# Patient Record
Sex: Female | Born: 1970 | Race: Black or African American | Hispanic: No | Marital: Married | Smoking: Never smoker
Health system: Southern US, Community
[De-identification: ages and names within clinical notes are randomized; demographics above are authoritative.]

## PROBLEM LIST (undated history)

## (undated) DIAGNOSIS — N760 Acute vaginitis: Secondary | ICD-10-CM

## (undated) DIAGNOSIS — B9689 Other specified bacterial agents as the cause of diseases classified elsewhere: Secondary | ICD-10-CM

## (undated) DIAGNOSIS — Z8619 Personal history of other infectious and parasitic diseases: Secondary | ICD-10-CM

## (undated) DIAGNOSIS — A499 Bacterial infection, unspecified: Secondary | ICD-10-CM

## (undated) DIAGNOSIS — K219 Gastro-esophageal reflux disease without esophagitis: Secondary | ICD-10-CM

## (undated) DIAGNOSIS — T7840XA Allergy, unspecified, initial encounter: Secondary | ICD-10-CM

## (undated) DIAGNOSIS — I8390 Asymptomatic varicose veins of unspecified lower extremity: Secondary | ICD-10-CM

## (undated) DIAGNOSIS — IMO0002 Reserved for concepts with insufficient information to code with codable children: Secondary | ICD-10-CM

## (undated) DIAGNOSIS — A749 Chlamydial infection, unspecified: Secondary | ICD-10-CM

## (undated) DIAGNOSIS — J45909 Unspecified asthma, uncomplicated: Secondary | ICD-10-CM

## (undated) DIAGNOSIS — G43909 Migraine, unspecified, not intractable, without status migrainosus: Secondary | ICD-10-CM

## (undated) DIAGNOSIS — L509 Urticaria, unspecified: Secondary | ICD-10-CM

## (undated) DIAGNOSIS — Z87898 Personal history of other specified conditions: Secondary | ICD-10-CM

## (undated) DIAGNOSIS — B3731 Acute candidiasis of vulva and vagina: Secondary | ICD-10-CM

## (undated) DIAGNOSIS — B373 Candidiasis of vulva and vagina: Secondary | ICD-10-CM

## (undated) HISTORY — PX: WISDOM TOOTH EXTRACTION: SHX21

## (undated) HISTORY — DX: Other specified bacterial agents as the cause of diseases classified elsewhere: B96.89

## (undated) HISTORY — DX: Personal history of other infectious and parasitic diseases: Z86.19

## (undated) HISTORY — DX: Candidiasis of vulva and vagina: B37.3

## (undated) HISTORY — DX: Reserved for concepts with insufficient information to code with codable children: IMO0002

## (undated) HISTORY — DX: Personal history of other specified conditions: Z87.898

## (undated) HISTORY — DX: Asymptomatic varicose veins of unspecified lower extremity: I83.90

## (undated) HISTORY — DX: Bacterial infection, unspecified: A49.9

## (undated) HISTORY — DX: Migraine, unspecified, not intractable, without status migrainosus: G43.909

## (undated) HISTORY — DX: Chlamydial infection, unspecified: A74.9

## (undated) HISTORY — DX: Unspecified asthma, uncomplicated: J45.909

## (undated) HISTORY — DX: Acute candidiasis of vulva and vagina: B37.31

## (undated) HISTORY — DX: Other specified bacterial agents as the cause of diseases classified elsewhere: N76.0

---

## 1998-08-22 ENCOUNTER — Encounter: Payer: Self-pay | Admitting: Emergency Medicine

## 1998-08-22 ENCOUNTER — Emergency Department (HOSPITAL_COMMUNITY): Admission: EM | Admit: 1998-08-22 | Discharge: 1998-08-22 | Payer: Self-pay | Admitting: Emergency Medicine

## 1998-08-25 ENCOUNTER — Ambulatory Visit (HOSPITAL_COMMUNITY): Admission: RE | Admit: 1998-08-25 | Discharge: 1998-08-25 | Payer: Self-pay | Admitting: Family Medicine

## 1999-03-01 ENCOUNTER — Other Ambulatory Visit: Admission: RE | Admit: 1999-03-01 | Discharge: 1999-03-01 | Payer: Self-pay | Admitting: Family Medicine

## 1999-11-11 ENCOUNTER — Emergency Department (HOSPITAL_COMMUNITY): Admission: EM | Admit: 1999-11-11 | Discharge: 1999-11-11 | Payer: Self-pay | Admitting: Emergency Medicine

## 1999-12-25 ENCOUNTER — Encounter: Payer: Self-pay | Admitting: *Deleted

## 1999-12-25 ENCOUNTER — Ambulatory Visit (HOSPITAL_COMMUNITY): Admission: RE | Admit: 1999-12-25 | Discharge: 1999-12-25 | Payer: Self-pay | Admitting: *Deleted

## 2000-03-09 ENCOUNTER — Inpatient Hospital Stay (HOSPITAL_COMMUNITY): Admission: AD | Admit: 2000-03-09 | Discharge: 2000-03-09 | Payer: Self-pay | Admitting: *Deleted

## 2000-04-06 ENCOUNTER — Inpatient Hospital Stay (HOSPITAL_COMMUNITY): Admission: AD | Admit: 2000-04-06 | Discharge: 2000-04-06 | Payer: Self-pay | Admitting: *Deleted

## 2000-04-11 ENCOUNTER — Inpatient Hospital Stay (HOSPITAL_COMMUNITY): Admission: AD | Admit: 2000-04-11 | Discharge: 2000-04-11 | Payer: Self-pay | Admitting: *Deleted

## 2000-04-16 ENCOUNTER — Inpatient Hospital Stay (HOSPITAL_COMMUNITY): Admission: AD | Admit: 2000-04-16 | Discharge: 2000-04-18 | Payer: Self-pay | Admitting: *Deleted

## 2000-11-11 ENCOUNTER — Emergency Department (HOSPITAL_COMMUNITY): Admission: EM | Admit: 2000-11-11 | Discharge: 2000-11-11 | Payer: Self-pay | Admitting: Emergency Medicine

## 2001-01-06 ENCOUNTER — Emergency Department (HOSPITAL_COMMUNITY): Admission: EM | Admit: 2001-01-06 | Discharge: 2001-01-06 | Payer: Self-pay | Admitting: Emergency Medicine

## 2001-09-15 ENCOUNTER — Other Ambulatory Visit: Admission: RE | Admit: 2001-09-15 | Discharge: 2001-09-15 | Payer: Self-pay | Admitting: Obstetrics and Gynecology

## 2001-09-20 ENCOUNTER — Emergency Department (HOSPITAL_COMMUNITY): Admission: EM | Admit: 2001-09-20 | Discharge: 2001-09-20 | Payer: Self-pay | Admitting: Emergency Medicine

## 2001-09-20 ENCOUNTER — Encounter: Payer: Self-pay | Admitting: Emergency Medicine

## 2001-12-06 ENCOUNTER — Emergency Department (HOSPITAL_COMMUNITY): Admission: EM | Admit: 2001-12-06 | Discharge: 2001-12-06 | Payer: Self-pay | Admitting: Emergency Medicine

## 2001-12-24 ENCOUNTER — Encounter: Payer: Self-pay | Admitting: Internal Medicine

## 2001-12-24 ENCOUNTER — Encounter: Admission: RE | Admit: 2001-12-24 | Discharge: 2001-12-24 | Payer: Self-pay | Admitting: Internal Medicine

## 2002-03-06 ENCOUNTER — Emergency Department (HOSPITAL_COMMUNITY): Admission: EM | Admit: 2002-03-06 | Discharge: 2002-03-07 | Payer: Self-pay

## 2002-06-26 ENCOUNTER — Emergency Department (HOSPITAL_COMMUNITY): Admission: EM | Admit: 2002-06-26 | Discharge: 2002-06-26 | Payer: Self-pay | Admitting: Emergency Medicine

## 2002-08-02 ENCOUNTER — Emergency Department (HOSPITAL_COMMUNITY): Admission: EM | Admit: 2002-08-02 | Discharge: 2002-08-02 | Payer: Self-pay | Admitting: *Deleted

## 2002-09-02 ENCOUNTER — Encounter: Admission: RE | Admit: 2002-09-02 | Discharge: 2002-09-24 | Payer: Self-pay | Admitting: Internal Medicine

## 2002-12-27 ENCOUNTER — Encounter: Payer: Self-pay | Admitting: Emergency Medicine

## 2002-12-27 ENCOUNTER — Emergency Department (HOSPITAL_COMMUNITY): Admission: EM | Admit: 2002-12-27 | Discharge: 2002-12-27 | Payer: Self-pay | Admitting: Emergency Medicine

## 2003-05-08 ENCOUNTER — Encounter: Payer: Self-pay | Admitting: Emergency Medicine

## 2003-05-08 ENCOUNTER — Emergency Department (HOSPITAL_COMMUNITY): Admission: EM | Admit: 2003-05-08 | Discharge: 2003-05-08 | Payer: Self-pay | Admitting: Emergency Medicine

## 2003-09-04 ENCOUNTER — Emergency Department (HOSPITAL_COMMUNITY): Admission: EM | Admit: 2003-09-04 | Discharge: 2003-09-04 | Payer: Self-pay | Admitting: Emergency Medicine

## 2003-09-12 ENCOUNTER — Encounter: Admission: RE | Admit: 2003-09-12 | Discharge: 2003-09-12 | Payer: Self-pay | Admitting: Internal Medicine

## 2003-12-17 ENCOUNTER — Emergency Department (HOSPITAL_COMMUNITY): Admission: EM | Admit: 2003-12-17 | Discharge: 2003-12-17 | Payer: Self-pay | Admitting: Emergency Medicine

## 2004-01-01 ENCOUNTER — Emergency Department (HOSPITAL_COMMUNITY): Admission: EM | Admit: 2004-01-01 | Discharge: 2004-01-01 | Payer: Self-pay | Admitting: *Deleted

## 2004-03-03 ENCOUNTER — Emergency Department (HOSPITAL_COMMUNITY): Admission: EM | Admit: 2004-03-03 | Discharge: 2004-03-03 | Payer: Self-pay | Admitting: Family Medicine

## 2004-03-08 ENCOUNTER — Emergency Department (HOSPITAL_COMMUNITY): Admission: EM | Admit: 2004-03-08 | Discharge: 2004-03-08 | Payer: Self-pay

## 2004-06-24 ENCOUNTER — Emergency Department (HOSPITAL_COMMUNITY): Admission: EM | Admit: 2004-06-24 | Discharge: 2004-06-24 | Payer: Self-pay | Admitting: Emergency Medicine

## 2006-11-06 ENCOUNTER — Emergency Department (HOSPITAL_COMMUNITY): Admission: EM | Admit: 2006-11-06 | Discharge: 2006-11-07 | Payer: Self-pay | Admitting: Emergency Medicine

## 2007-08-08 ENCOUNTER — Emergency Department (HOSPITAL_COMMUNITY): Admission: EM | Admit: 2007-08-08 | Discharge: 2007-08-09 | Payer: Self-pay | Admitting: Emergency Medicine

## 2008-09-24 ENCOUNTER — Emergency Department (HOSPITAL_COMMUNITY): Admission: EM | Admit: 2008-09-24 | Discharge: 2008-09-24 | Payer: Self-pay | Admitting: Emergency Medicine

## 2009-11-11 DIAGNOSIS — R87619 Unspecified abnormal cytological findings in specimens from cervix uteri: Secondary | ICD-10-CM

## 2009-11-11 DIAGNOSIS — IMO0002 Reserved for concepts with insufficient information to code with codable children: Secondary | ICD-10-CM

## 2009-11-11 HISTORY — DX: Reserved for concepts with insufficient information to code with codable children: IMO0002

## 2009-11-11 HISTORY — DX: Unspecified abnormal cytological findings in specimens from cervix uteri: R87.619

## 2011-02-23 ENCOUNTER — Other Ambulatory Visit: Payer: Self-pay | Admitting: Internal Medicine

## 2011-02-23 MED ORDER — CIPROFLOXACIN HCL 500 MG PO TABS: 500.0000 mg | ORAL_TABLET | Freq: Two times a day (BID) | ORAL | Status: AC

## 2011-05-09 ENCOUNTER — Other Ambulatory Visit: Payer: Self-pay | Admitting: Internal Medicine

## 2011-05-09 MED ORDER — AZITHROMYCIN 250 MG PO TABS: ORAL_TABLET | ORAL | Status: AC

## 2011-08-16 ENCOUNTER — Other Ambulatory Visit: Payer: Self-pay | Admitting: Internal Medicine

## 2011-08-16 DIAGNOSIS — Z1231 Encounter for screening mammogram for malignant neoplasm of breast: Secondary | ICD-10-CM

## 2011-08-21 ENCOUNTER — Inpatient Hospital Stay (INDEPENDENT_AMBULATORY_CARE_PROVIDER_SITE_OTHER)
Admission: RE | Admit: 2011-08-21 | Discharge: 2011-08-21 | Disposition: A | Discharge status: Home / Self Care | Payer: 59 | Source: Ambulatory Visit | Attending: Emergency Medicine | Admitting: Emergency Medicine

## 2011-08-21 ENCOUNTER — Ambulatory Visit (INDEPENDENT_AMBULATORY_CARE_PROVIDER_SITE_OTHER): Payer: 59

## 2011-08-21 DIAGNOSIS — S93609A Unspecified sprain of unspecified foot, initial encounter: Secondary | ICD-10-CM

## 2011-08-21 DIAGNOSIS — S91109A Unspecified open wound of unspecified toe(s) without damage to nail, initial encounter: Secondary | ICD-10-CM

## 2011-08-30 ENCOUNTER — Ambulatory Visit: Payer: Self-pay

## 2011-09-02 ENCOUNTER — Ambulatory Visit
Admission: RE | Admit: 2011-09-02 | Discharge: 2011-09-02 | Disposition: A | Discharge status: Home / Self Care | Payer: 59 | Source: Ambulatory Visit | Attending: Internal Medicine | Admitting: Internal Medicine

## 2011-09-02 DIAGNOSIS — Z1231 Encounter for screening mammogram for malignant neoplasm of breast: Secondary | ICD-10-CM

## 2011-11-03 ENCOUNTER — Other Ambulatory Visit: Payer: Self-pay | Admitting: Internal Medicine

## 2011-11-03 MED ORDER — LIDOCAINE VISCOUS 2 % MT SOLN: 20.0000 mL | OROMUCOSAL | Status: AC | PRN

## 2011-11-03 MED ORDER — DIPHENHYD-HYDROCORT-NYSTATIN MT SUSP: 15.0000 mL | Freq: Three times a day (TID) | OROMUCOSAL | Status: DC | PRN

## 2011-11-03 MED ORDER — LIDOCAINE VISCOUS 2 % MT SOLN: 20.0000 mL | OROMUCOSAL | Status: DC | PRN

## 2011-11-03 NOTE — Progress Notes (Signed)
Addended by: Edsel Petrin on: 11/03/2011 08:01 PM   Modules accepted: Orders

## 2012-05-22 ENCOUNTER — Ambulatory Visit (INDEPENDENT_AMBULATORY_CARE_PROVIDER_SITE_OTHER): Payer: 59 | Admitting: Obstetrics and Gynecology

## 2012-05-22 ENCOUNTER — Encounter: Payer: Self-pay | Admitting: Obstetrics and Gynecology

## 2012-05-22 VITALS — BP 100/62 | Ht 62.0 in | Wt 118.0 lb

## 2012-05-22 DIAGNOSIS — Z01419 Encounter for gynecological examination (general) (routine) without abnormal findings: Secondary | ICD-10-CM

## 2012-05-22 DIAGNOSIS — N898 Other specified noninflammatory disorders of vagina: Secondary | ICD-10-CM

## 2012-05-22 LAB — POCT WET PREP (WET MOUNT)
Bacteria Wet Prep HPF POC: NEGATIVE
Clue Cells Wet Prep Whiff POC: NEGATIVE
Trichomonas Wet Prep HPF POC: NEGATIVE
pH: 4.5

## 2012-05-22 NOTE — Progress Notes (Signed)
C/o moodiness, hot flashes, stress related to getting doctorate, increased sensitivity at clitoris  Filed Vitals:   05/22/12 1403  BP: 100/62   ROS: noncontributory  Pelvic exam:  VULVA: normal appearing vulva with no masses, tenderness or lesions,  VAGINA: normal appearing vagina with normal color and discharge, no lesions, CERVIX: normal appearing cervix without discharge or lesions,  UTERUS: uterus is normal size, shape, consistency and nontender,  ADNEXA: normal adnexa in size, nontender and no masses.  Results for orders placed in visit on 05/22/12  POCT WET PREP (WET [place]OUNT)      Component Value Range   Source Wet Prep POC       WBC, Wet Prep HPF POC neg     Bacteria Wet Prep HPF POC neg     BACTERIA WET PREP MORPHOLOGY POC       Clue Cells Wet Prep HPF POC None     CLUE CELLS WET PREP WHIFF POC Negative Whiff     Yeast Wet Prep HPF POC None     KOH Wet Prep POC neg     Trichomonas Wet Prep HPF POC neg     pH        A/P Wet prep Discussed options for hormonal mgmt which are limited.  Pt is already on loestrin but stopped about 1wk ago.  She is interested in changing back to depo.  I think that will help decrease her risks of the combined methods but may exacerbate mood swings.  Hot flashes may be perimenopausal but if not helped with OCPs, she may have to wait it out.  She has never had normal cycles and had bloodwork recently with nl TSH, CBC and vit D. RTO for AEX

## 2012-05-26 ENCOUNTER — Telehealth: Payer: Self-pay | Admitting: Obstetrics and Gynecology

## 2012-05-26 NOTE — Telephone Encounter (Signed)
Triage/epic 

## 2012-05-26 NOTE — Telephone Encounter (Signed)
Per AR, I called two new Rx's to [facility] at [address] and [address]. One is Depo Provera 150 mg/ ml 1x q 3 months IM w/ RF's through 05/2013. The next one was Lotrisone cream 15 gm tube to use once daily to the affected area(clitoris). w one RF. Melody Comas A

## 2012-06-02 ENCOUNTER — Emergency Department (HOSPITAL_COMMUNITY)
Admission: EM | Admit: 2012-06-02 | Discharge: 2012-06-03 | Disposition: A | Discharge status: Home / Self Care | Payer: 59 | Attending: Emergency Medicine | Admitting: Emergency Medicine

## 2012-06-02 ENCOUNTER — Emergency Department (HOSPITAL_COMMUNITY): Payer: 59

## 2012-06-02 ENCOUNTER — Encounter (HOSPITAL_COMMUNITY): Payer: Self-pay | Admitting: Emergency Medicine

## 2012-06-02 DIAGNOSIS — Q742 Other congenital malformations of lower limb(s), including pelvic girdle: Secondary | ICD-10-CM

## 2012-06-02 DIAGNOSIS — M79606 Pain in leg, unspecified: Secondary | ICD-10-CM

## 2012-06-02 DIAGNOSIS — M79609 Pain in unspecified limb: Secondary | ICD-10-CM | POA: Insufficient documentation

## 2012-06-02 MED ORDER — HYDROCODONE-ACETAMINOPHEN 5-325 MG PO TABS
1.0000 | ORAL_TABLET | Freq: Once | ORAL | Status: AC
  Administered 2012-06-02: 1 via ORAL
  Filled 2012-06-02: qty 1

## 2012-06-02 MED ORDER — OXYCODONE-ACETAMINOPHEN 5-325 MG PO TABS
1.0000 | ORAL_TABLET | Freq: Once | ORAL | Status: AC
  Administered 2012-06-02: 1 via ORAL
  Filled 2012-06-02: qty 1

## 2012-06-02 MED ORDER — IBUPROFEN 400 MG PO TABS
800.0000 mg | ORAL_TABLET | Freq: Once | ORAL | Status: AC
  Administered 2012-06-02: 800 mg via ORAL
  Filled 2012-06-02: qty 2
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
## 2012-06-02 NOTE — ED Notes (Signed)
Pt reports after going to x-ray she is having a new pain, now c/o right pelvis pain and upper right thigh pain, pain worsens when she sits up.

## 2012-06-03 MED ORDER — NAPROXEN 375 MG PO TABS: 375.0000 mg | ORAL_TABLET | Freq: Two times a day (BID) | ORAL | Status: AC

## 2012-06-03 MED ORDER — OXYCODONE-ACETAMINOPHEN 5-325 MG PO TABS: 1.0000 | ORAL_TABLET | Freq: Four times a day (QID) | ORAL | Status: AC | PRN

## 2012-06-03 NOTE — ED Provider Notes (Signed)
Medical screening examination/treatment/procedure(s) were performed by non-physician practitioner and as supervising physician I was immediately available for consultation/collaboration.  Gerhard Munch, MD 06/03/12 [phone]
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
## 2012-06-03 NOTE — ED Notes (Signed)
Ortho called 

## 2012-06-09 ENCOUNTER — Other Ambulatory Visit (INDEPENDENT_AMBULATORY_CARE_PROVIDER_SITE_OTHER): Payer: 59

## 2012-06-09 ENCOUNTER — Telehealth: Payer: Self-pay | Admitting: Obstetrics and Gynecology

## 2012-06-09 DIAGNOSIS — Z3009 Encounter for other general counseling and advice on contraception: Secondary | ICD-10-CM

## 2012-06-09 MED ORDER — MEDROXYPROGESTERONE ACETATE 150 MG/ML IM SUSP
150.0000 mg | Freq: Once | INTRAMUSCULAR | Status: AC
  Administered 2012-06-09: 150 mg via INTRAMUSCULAR

## 2012-06-09 NOTE — Progress Notes (Unsigned)
Next Depo due 08-31-2012.  Pt states started menses on 06-08-12 advised to make appt when period started.  Pt denies any unprotected intercourse.

## 2012-06-11 ENCOUNTER — Other Ambulatory Visit: Payer: Self-pay | Admitting: Nurse Practitioner

## 2012-06-11 ENCOUNTER — Ambulatory Visit
Admission: RE | Admit: 2012-06-11 | Discharge: 2012-06-11 | Disposition: A | Discharge status: Home / Self Care | Payer: 59 | Source: Ambulatory Visit | Attending: Nurse Practitioner | Admitting: Nurse Practitioner

## 2012-06-11 DIAGNOSIS — M79609 Pain in unspecified limb: Secondary | ICD-10-CM

## 2012-08-27 ENCOUNTER — Telehealth: Payer: Self-pay | Admitting: Obstetrics and Gynecology

## 2012-08-28 NOTE — Telephone Encounter (Signed)
TC to pt. States has had dark, red "pastey" D/C every day since starting Depo. Is aware may have irreg bleeding but is unsure if wants to continue if this will not resolve.  To schedule appt with EP prior to Depo 08/31/12 if able to make payments arrangements.

## 2012-08-31 ENCOUNTER — Ambulatory Visit (INDEPENDENT_AMBULATORY_CARE_PROVIDER_SITE_OTHER): Payer: 59 | Admitting: Obstetrics and Gynecology

## 2012-08-31 ENCOUNTER — Encounter: Payer: Self-pay | Admitting: Obstetrics and Gynecology

## 2012-08-31 ENCOUNTER — Other Ambulatory Visit: Payer: 59

## 2012-08-31 VITALS — BP 114/70 | HR 74 | Wt 127.0 lb

## 2012-08-31 DIAGNOSIS — N926 Irregular menstruation, unspecified: Secondary | ICD-10-CM

## 2012-08-31 DIAGNOSIS — Z309 Encounter for contraceptive management, unspecified: Secondary | ICD-10-CM

## 2012-08-31 LAB — POCT URINE PREGNANCY: Preg Test, Ur: NEGATIVE

## 2012-08-31 MED ORDER — NORETHIN-ETH ESTRAD-FE BIPHAS 1 MG-10 MCG / 10 MCG PO TABS: 1.0000 | ORAL_TABLET | Freq: Every day | ORAL | Status: DC

## 2012-08-31 NOTE — Progress Notes (Signed)
41 YO with daily bleeding requiring a panty liner 3-4/day since Depo Provera injection in July. Next Depo provera due today but wants to consider another method.   O:  UPT-negative       Pelvic: EGBUS-wnl, vagina-moderate brown discharge, cervix-no lesions, uterus/adnexae-no tenderness or masses  A: Irregular Bleeding with Depo Provera  P:  Begin lo loestrin 24 Fe today,  1 po qd  #3 samples given      Reviewed risks of VTE events and side effects      BCP Instructiion Sheet given      RTO-as scheduled or prn  Jenetta Wease, PA-C

## 2012-08-31 NOTE — Progress Notes (Signed)
When did bleeding start: IN JULY How  Long: SINCE PT HAD DEPO PROVERA INJECTION WHICH WAS IN JULY How often changing pad/tampon: 2-3 X A DAY. PT ONLY WEARS PANTY SHEILDS Bleeding Disorders: no Cramping: no Contraception: yes Fibroids: no Hormone Therapy: no New Medications: no Menopausal Symptoms: no Vag. 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

## 2012-11-18 ENCOUNTER — Telehealth: Payer: Self-pay | Admitting: Obstetrics and Gynecology

## 2012-11-18 NOTE — Telephone Encounter (Signed)
LVM to advise pt that samples would be waiting up front from her  Darien Ramus, CMA

## 2013-01-01 ENCOUNTER — Other Ambulatory Visit: Payer: Self-pay | Admitting: Internal Medicine

## 2013-01-01 MED ORDER — AZITHROMYCIN 250 MG PO TABS: ORAL_TABLET | ORAL | Status: DC

## 2013-01-05 ENCOUNTER — Other Ambulatory Visit: Payer: Self-pay | Admitting: Internal Medicine

## 2013-01-05 MED ORDER — LEVOFLOXACIN 500 MG PO TABS: 500.0000 mg | ORAL_TABLET | Freq: Every day | ORAL | Status: AC

## 2013-01-06 ENCOUNTER — Other Ambulatory Visit: Payer: Self-pay | Admitting: Internal Medicine

## 2013-01-06 DIAGNOSIS — Z1231 Encounter for screening mammogram for malignant neoplasm of breast: Secondary | ICD-10-CM

## 2013-01-07 ENCOUNTER — Ambulatory Visit: Payer: 59

## 2013-01-18 ENCOUNTER — Telehealth: Payer: Self-pay | Admitting: Obstetrics and Gynecology

## 2013-01-18 MED ORDER — NORETHIN-ETH ESTRAD-FE BIPHAS 1 MG-10 MCG / 10 MCG PO TABS: 1.0000 | ORAL_TABLET | Freq: Every day | ORAL | Status: DC

## 2013-01-18 NOTE — Telephone Encounter (Signed)
2 samples given. rx sent to pharmacy.  Darien Ramus, CMA

## 2013-02-01 ENCOUNTER — Ambulatory Visit
Admission: RE | Admit: 2013-02-01 | Discharge: 2013-02-01 | Disposition: A | Discharge status: Home / Self Care | Payer: BC Managed Care – PPO | Source: Ambulatory Visit | Attending: Internal Medicine | Admitting: Internal Medicine

## 2013-02-01 DIAGNOSIS — Z1231 Encounter for screening mammogram for malignant neoplasm of breast: Secondary | ICD-10-CM

## 2014-09-12 ENCOUNTER — Encounter: Payer: Self-pay | Admitting: Obstetrics and Gynecology

## 2016-06-30 ENCOUNTER — Encounter (HOSPITAL_COMMUNITY): Payer: Self-pay | Admitting: Emergency Medicine

## 2016-06-30 ENCOUNTER — Emergency Department (HOSPITAL_COMMUNITY)
Admission: EM | Admit: 2016-06-30 | Discharge: 2016-07-01 | Disposition: A | Discharge status: Home / Self Care | Payer: Medicaid Other | Attending: Emergency Medicine | Admitting: Emergency Medicine

## 2016-06-30 ENCOUNTER — Emergency Department (HOSPITAL_COMMUNITY): Payer: Medicaid Other

## 2016-06-30 DIAGNOSIS — R0602 Shortness of breath: Secondary | ICD-10-CM | POA: Diagnosis not present

## 2016-06-30 DIAGNOSIS — R51 Headache: Secondary | ICD-10-CM | POA: Diagnosis not present

## 2016-06-30 DIAGNOSIS — R05 Cough: Secondary | ICD-10-CM | POA: Diagnosis not present

## 2016-06-30 DIAGNOSIS — R0789 Other chest pain: Secondary | ICD-10-CM | POA: Diagnosis not present

## 2016-06-30 DIAGNOSIS — Z793 Long term (current) use of hormonal contraceptives: Secondary | ICD-10-CM | POA: Insufficient documentation

## 2016-06-30 DIAGNOSIS — R519 Headache, unspecified: Secondary | ICD-10-CM

## 2016-06-30 DIAGNOSIS — R0989 Other specified symptoms and signs involving the circulatory and respiratory systems: Secondary | ICD-10-CM

## 2016-06-30 MED ORDER — KETOROLAC TROMETHAMINE 15 MG/ML IJ SOLN
15.0000 mg | Freq: Once | INTRAMUSCULAR | Status: AC
  Administered 2016-07-01: 15 mg via INTRAVENOUS
  Filled 2016-06-30: qty 1

## 2016-06-30 MED ORDER — METOCLOPRAMIDE HCL 5 MG/ML IJ SOLN
10.0000 mg | Freq: Once | INTRAMUSCULAR | Status: AC
  Administered 2016-07-01: 10 mg via INTRAVENOUS
  Filled 2016-06-30: qty 2

## 2016-06-30 MED ORDER — DIPHENHYDRAMINE HCL 50 MG/ML IJ SOLN
12.5000 mg | Freq: Once | INTRAMUSCULAR | Status: AC
  Administered 2016-06-30: 12.5 mg via INTRAVENOUS
  Filled 2016-06-30: qty 1

## 2016-06-30 MED ORDER — MAGNESIUM SULFATE IN D5W 1-5 GM/100ML-% IV SOLN
1.0000 g | Freq: Once | INTRAVENOUS | Status: AC
  Administered 2016-06-30: 1 g via INTRAVENOUS
  Filled 2016-06-30: qty 100

## 2016-06-30 MED ORDER — KETOROLAC TROMETHAMINE 30 MG/ML IJ SOLN
15.0000 mg | Freq: Once | INTRAMUSCULAR | Status: AC
  Administered 2016-06-30: 15 mg via INTRAVENOUS
  Filled 2016-06-30: qty 1

## 2016-06-30 MED ORDER — LORAZEPAM 2 MG/ML IJ SOLN
1.0000 mg | Freq: Once | INTRAMUSCULAR | Status: AC
  Administered 2016-07-01: 1 mg via INTRAVENOUS
  Filled 2016-06-30: qty 1

## 2016-06-30 MED ORDER — SODIUM CHLORIDE 0.9 % IV BOLUS (SEPSIS)
1000.0000 mL | Freq: Once | INTRAVENOUS | Status: AC
  Administered 2016-06-30: 1000 mL via INTRAVENOUS

## 2016-06-30 MED ORDER — ONDANSETRON HCL 4 MG/2ML IJ SOLN
4.0000 mg | Freq: Once | INTRAMUSCULAR | Status: AC
  Administered 2016-06-30: 4 mg via INTRAVENOUS
  Filled 2016-06-30: qty 2
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
## 2016-06-30 NOTE — ED Triage Notes (Signed)
Patient c/o migraine x several days.  Patient that states she is under a lot of stress and trying to look for a new job.  Patient states that has sensitivity to light and denies blurred vision or trouble seeing.  Patient having post nasal drainage and non productive cough at night.  Patient been taking OTC meds for headaceh but not working.

## 2016-06-30 NOTE — ED Notes (Signed)
Called Pt from the lobby with no answer

## 2016-07-01 ENCOUNTER — Emergency Department (HOSPITAL_COMMUNITY): Payer: Medicaid Other

## 2016-07-01 LAB — I-STAT CHEM 8, ED
BUN: 11 mg/dL (ref 6–20)
CALCIUM ION: 1.13 mmol/L (ref 1.13–1.30)
CHLORIDE: 106 mmol/L (ref 101–111)
Creatinine, Ser: 0.8 mg/dL (ref 0.44–1.00)
GLUCOSE: 93 mg/dL (ref 65–99)
HCT: 42 % (ref 36.0–46.0)
HEMOGLOBIN: 14.3 g/dL (ref 12.0–15.0)
Potassium: 3.7 mmol/L (ref 3.5–5.1)
SODIUM: 139 mmol/L (ref 135–145)
TCO2: 21 mmol/L (ref 0–100)

## 2016-07-01 LAB — BRAIN NATRIURETIC PEPTIDE: B NATRIURETIC PEPTIDE 5: 22.3 pg/mL (ref 0.0–100.0)

## 2016-07-01 MED ORDER — PSEUDOEPHEDRINE HCL ER 120 MG PO TB12: 120.0000 mg | ORAL_TABLET | Freq: Two times a day (BID) | ORAL | 0 refills | Status: DC | PRN

## 2016-07-01 MED ORDER — CETIRIZINE HCL 10 MG PO TABS: 10.0000 mg | ORAL_TABLET | Freq: Every day | ORAL | 0 refills | Status: DC

## 2016-07-01 MED ORDER — LORAZEPAM 2 MG/ML IJ SOLN
1.0000 mg | Freq: Once | INTRAMUSCULAR | Status: AC
  Administered 2016-07-01: 1 mg via INTRAVENOUS
  Filled 2016-07-01: qty 1

## 2016-07-01 MED ORDER — GUAIFENESIN ER 600 MG PO TB12
600.0000 mg | ORAL_TABLET | Freq: Two times a day (BID) | ORAL | 0 refills | Status: DC | PRN
Start: 2016-07-01 — End: 2018-02-16

## 2016-07-01 NOTE — ED Notes (Signed)
Pt returned from MRI °

## 2016-07-01 NOTE — ED Provider Notes (Signed)
Signout received from Dr. Clydene PughKnott.  Please refer to their note for full history, physical exam, and original plan.  In brief, Emma Larsen is a 45 y.o. female with headache, improved with migraine medicines, but pending MRV.  MDM:  MRV unremarkable.  Patient without headache at this time and sleeping comfortably.  Neurologic Exam Alert and oriented CN II-XII grossly intact Eyes: PERRL, EOMI Bilateral UE strength 5/5 Bilateral LE strength 5/5 Intact gait  Impression of the event is likely migraine headache.  Will have her f/u with PCP.  We have discussed the discharge plan, including the plan for outpatient followup, and strict return precautions, including those that would require calling 911.    Patient discussed with Dr. Rubin PayorPickering, who voiced agreement with plan.    Emma MorelMichael Dajana Gehrig, MD 07/01/16 [id number]    Emma CoreNathan Pickering, MD 07/02/16 [phone]

## 2016-07-01 NOTE — Discharge Instructions (Addendum)
For your chest congestion, you should try taking over the counter Zyrtec daily, as well as guaifenesin-dextromethorphan as directed by the bottle.  Read the information below.   Your EKG and chest x-ray were re-assuring. Your labs were normal. CT of your head was normal.  Continue to take 600mg  motrin every 6hrs for the next few days.  I have provided the contact information for Neurology. Please call to schedule a follow up appointment regarding your headaches.  Please call your primary care doctor to schedule a follow up appointment for re-evaluation following your ED visit.  You may return to the Emergency Department at any time for worsening condition or any new symptoms that concern you. Return to the ED if you develop facial droop, slurred speech, numbness/weakness, fever, chest pain, difficulty breathing while walking, chest pain with deep inspiration, or one sided leg swelling/pain.

## 2016-07-01 NOTE — ED Provider Notes (Signed)
Patient was seen and evaluated on arrival to the emergency department.  MRV ordered, will f/u to evaluate for dural sinus thrombosis.   Lyndal Pulleyaniel Kainon Varady, MD 07/01/16 [phone]

## 2016-07-01 NOTE — ED Notes (Signed)
Patient transported to MRI 

## 2016-07-05 ENCOUNTER — Other Ambulatory Visit: Payer: Self-pay | Admitting: Internal Medicine

## 2016-07-05 DIAGNOSIS — Z1231 Encounter for screening mammogram for malignant neoplasm of breast: Secondary | ICD-10-CM

## 2016-07-09 ENCOUNTER — Ambulatory Visit: Payer: Medicaid Other

## 2016-07-09 ENCOUNTER — Ambulatory Visit
Admission: RE | Admit: 2016-07-09 | Discharge: 2016-07-09 | Disposition: A | Discharge status: Home / Self Care | Payer: Medicaid Other | Source: Ambulatory Visit | Attending: Internal Medicine | Admitting: Internal Medicine

## 2016-07-09 DIAGNOSIS — Z1231 Encounter for screening mammogram for malignant neoplasm of breast: Secondary | ICD-10-CM

## 2016-09-12 ENCOUNTER — Emergency Department (HOSPITAL_COMMUNITY)
Admission: EM | Admit: 2016-09-12 | Discharge: 2016-09-12 | Disposition: A | Discharge status: Home / Self Care | Payer: 59 | Attending: Emergency Medicine | Admitting: Emergency Medicine

## 2016-09-12 ENCOUNTER — Emergency Department (HOSPITAL_COMMUNITY): Payer: 59

## 2016-09-12 ENCOUNTER — Encounter (HOSPITAL_COMMUNITY): Payer: Self-pay | Admitting: Emergency Medicine

## 2016-09-12 DIAGNOSIS — K5900 Constipation, unspecified: Secondary | ICD-10-CM

## 2016-09-12 DIAGNOSIS — R51 Headache: Secondary | ICD-10-CM | POA: Insufficient documentation

## 2016-09-12 DIAGNOSIS — R112 Nausea with vomiting, unspecified: Secondary | ICD-10-CM

## 2016-09-12 DIAGNOSIS — K219 Gastro-esophageal reflux disease without esophagitis: Secondary | ICD-10-CM | POA: Diagnosis not present

## 2016-09-12 DIAGNOSIS — R519 Headache, unspecified: Secondary | ICD-10-CM

## 2016-09-12 LAB — COMPREHENSIVE METABOLIC PANEL
ALBUMIN: 4.1 g/dL (ref 3.5–5.0)
ALK PHOS: 52 U/L (ref 38–126)
ALT: 18 U/L (ref 14–54)
AST: 20 U/L (ref 15–41)
Anion gap: 8 (ref 5–15)
BILIRUBIN TOTAL: 1.1 mg/dL (ref 0.3–1.2)
BUN: 12 mg/dL (ref 6–20)
CALCIUM: 9.2 mg/dL (ref 8.9–10.3)
CO2: 26 mmol/L (ref 22–32)
Chloride: 102 mmol/L (ref 101–111)
Creatinine, Ser: 0.81 mg/dL (ref 0.44–1.00)
GFR calc Af Amer: >60 mL/min (ref >60)
GFR calc non Af Amer: >60 mL/min (ref >60)
GLUCOSE: 97 mg/dL (ref 65–99)
Potassium: 3.5 mmol/L (ref 3.5–5.1)
Sodium: 136 mmol/L (ref 135–145)
TOTAL PROTEIN: 7.8 g/dL (ref 6.5–8.1)

## 2016-09-12 LAB — URINALYSIS, ROUTINE W REFLEX MICROSCOPIC
BILIRUBIN URINE: NEGATIVE
Glucose, UA: NEGATIVE mg/dL
HGB URINE DIPSTICK: NEGATIVE
KETONES UR: 15 mg/dL — AB
Leukocytes, UA: NEGATIVE
NITRITE: NEGATIVE
Protein, ur: NEGATIVE mg/dL
Specific Gravity, Urine: 1.028 (ref 1.005–1.030)
pH: 7 (ref 5.0–8.0)

## 2016-09-12 LAB — I-STAT BETA HCG BLOOD, ED (MC, WL, AP ONLY)

## 2016-09-12 LAB — CBC
HCT: 44.2 % (ref 36.0–46.0)
Hemoglobin: 15.4 g/dL — ABNORMAL HIGH (ref 12.0–15.0)
MCH: 30.6 pg (ref 26.0–34.0)
MCHC: 34.8 g/dL (ref 30.0–36.0)
MCV: 87.7 fL (ref 78.0–100.0)
Platelets: 230 10*3/uL (ref 150–400)
RBC: 5.04 MIL/uL (ref 3.87–5.11)
RDW: 12.1 % (ref 11.5–15.5)
WBC: 7.5 10*3/uL (ref 4.0–10.5)

## 2016-09-12 LAB — LIPASE, BLOOD: Lipase: 23 U/L (ref 11–51)

## 2016-09-12 MED ORDER — GI COCKTAIL ~~LOC~~
30.0000 mL | Freq: Once | ORAL | Status: AC
  Administered 2016-09-12: 30 mL via ORAL
  Filled 2016-09-12: qty 30

## 2016-09-12 MED ORDER — MAGNESIUM CITRATE PO SOLN
1.0000 | Freq: Once | ORAL | Status: AC
  Administered 2016-09-12: 1 via ORAL
  Filled 2016-09-12: qty 296

## 2016-09-12 MED ORDER — OMEPRAZOLE 20 MG PO CPDR: 20.0000 mg | DELAYED_RELEASE_CAPSULE | Freq: Every day | ORAL | 0 refills | Status: DC

## 2016-09-12 MED ORDER — POLYETHYLENE GLYCOL 3350 17 G PO PACK: 17.0000 g | PACK | Freq: Every day | ORAL | 0 refills | Status: DC

## 2016-09-12 MED ORDER — SODIUM CHLORIDE 0.9 % IV BOLUS (SEPSIS)
1000.0000 mL | Freq: Once | INTRAVENOUS | Status: AC
  Administered 2016-09-12: 1000 mL via INTRAVENOUS

## 2016-09-12 MED ORDER — ONDANSETRON HCL 4 MG/2ML IJ SOLN
4.0000 mg | Freq: Once | INTRAMUSCULAR | Status: AC
  Administered 2016-09-12: 4 mg via INTRAVENOUS
  Filled 2016-09-12: qty 2

## 2016-09-12 MED ORDER — KETOROLAC TROMETHAMINE 30 MG/ML IJ SOLN
30.0000 mg | Freq: Once | INTRAMUSCULAR | Status: AC
  Administered 2016-09-12: 30 mg via INTRAVENOUS
  Filled 2016-09-12: qty 1

## 2016-09-12 MED ORDER — ONDANSETRON 8 MG PO TBDP: 8.0000 mg | ORAL_TABLET | Freq: Three times a day (TID) | ORAL | 0 refills | Status: DC | PRN

## 2016-09-12 MED ORDER — SUCRALFATE 1 G PO TABS: 1.0000 g | ORAL_TABLET | Freq: Three times a day (TID) | ORAL | 0 refills | Status: DC
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
## 2016-09-12 NOTE — ED Triage Notes (Signed)
Pt c/o abdominal bloating, constipation x 2 weeks, and vomiting yellow bile. Pt sts she hasn't had a full bowel movement in two weeks, has only been able to pass small pieces at a time. Pt has not tried OTC laxatives. Pt sts she vomited four times this morning. A&Ox4 and ambulatory, Tearful in triage.

## 2016-09-12 NOTE — Discharge Instructions (Signed)
Start taking MiraLAX daily to prevent constipation. Drink plenty of fluids, eat high fiber diet. Take Prilosec and Carafate as prescribed for acid reflux. Zofran is prescribed as needed for nausea and vomiting. Please follow with a primary care doctor if not improving. Return if worsening symptoms.

## 2016-12-13 ENCOUNTER — Other Ambulatory Visit: Payer: Self-pay | Admitting: Internal Medicine

## 2016-12-13 MED ORDER — AZITHROMYCIN 250 MG PO TABS: ORAL_TABLET | ORAL | 0 refills | Status: AC

## 2016-12-13 NOTE — Progress Notes (Unsigned)
Patient with URI symptoms X7 days. Facial pain, severe congestion. Low grade fever. Will see PCP if symptoms worsen.

## 2017-08-15 ENCOUNTER — Other Ambulatory Visit: Payer: Self-pay | Admitting: Family

## 2017-08-15 DIAGNOSIS — Z1231 Encounter for screening mammogram for malignant neoplasm of breast: Secondary | ICD-10-CM

## 2017-08-19 ENCOUNTER — Ambulatory Visit: Payer: 59

## 2017-09-02 ENCOUNTER — Ambulatory Visit
Admission: RE | Admit: 2017-09-02 | Discharge: 2017-09-02 | Disposition: A | Discharge status: Home / Self Care | Payer: 59 | Source: Ambulatory Visit | Attending: Family | Admitting: Family

## 2017-09-02 DIAGNOSIS — Z1231 Encounter for screening mammogram for malignant neoplasm of breast: Secondary | ICD-10-CM

## 2017-12-30 ENCOUNTER — Encounter: Payer: Self-pay | Admitting: Physician Assistant

## 2018-01-08 ENCOUNTER — Ambulatory Visit: Payer: 59 | Admitting: Physician Assistant

## 2018-02-16 ENCOUNTER — Encounter: Payer: Self-pay | Admitting: Allergy & Immunology

## 2018-02-16 ENCOUNTER — Ambulatory Visit: Payer: 59 | Admitting: Allergy & Immunology

## 2018-02-16 VITALS — BP 110/80 | HR 62 | Temp 98.0°F | Resp 16 | Ht 62.0 in | Wt 152.2 lb

## 2018-02-16 DIAGNOSIS — K219 Gastro-esophageal reflux disease without esophagitis: Secondary | ICD-10-CM | POA: Insufficient documentation

## 2018-02-16 DIAGNOSIS — T781XXD Other adverse food reactions, not elsewhere classified, subsequent encounter: Secondary | ICD-10-CM | POA: Diagnosis not present

## 2018-02-16 DIAGNOSIS — R0602 Shortness of breath: Secondary | ICD-10-CM | POA: Insufficient documentation

## 2018-02-16 DIAGNOSIS — J310 Chronic rhinitis: Secondary | ICD-10-CM | POA: Insufficient documentation

## 2018-02-16 DIAGNOSIS — T781XXA Other adverse food reactions, not elsewhere classified, initial encounter: Secondary | ICD-10-CM | POA: Insufficient documentation

## 2018-02-16 NOTE — Patient Instructions (Addendum)
1. Chronic rhinitis - Your lung testing looked great today, so I do not think that this is related to asthma. - Histamine was non-reactive, therefore we will bring you back in for testing. - Amitriptyline has a long half life, so you should really be off of it for one week before testing. - We will see you again on Thursday for skin testing only. - in the meantime, try Dymista 1-2 sprays per nostril up to twice daily to control your postnasal drip.   2. Return in about 3 days (around 02/19/2018).   Please inform us of any Emergency Department visits, hospitalizations, or changes in symptoms. Call us before going to the ED for breathing or allergy symptoms since we might be able to fit you in for a sick visit. Feel free to contact us anytime with any questions, problems, or concerns.  It was a pleasure to meet you today!  Websites that have reliable patient information: 1. American Academy of Asthma, Allergy, and Immunology: www.aaaai.org 2. Food Allergy Research and Education (FARE): foodallergy.org 3. Mothers of Asthmatics: http://www.asthmacommunitynetwork.org 4. American College of Allergy, Asthma, and Immunology: www.acaai.org

## 2018-02-16 NOTE — Progress Notes (Signed)
NEW PATIENT  Date of Service/Encounter:  02/16/18  Referring provider: Truman Hayward, FNP   Assessment:   Chronic rhinitis  Postnasal drip  Shortness of breath   Adverse food reaction (shrimp)  GERD - on maximized medications  Plan/Recommendations:   1. Chronic rhinitis - Your lung testing looked great today, so I do not think that this is related to asthma. - Histamine was non-reactive, therefore we will bring you back in for testing. - Amitriptyline has a long half life, so you should really be off of it for one week before testing. - We will see you again on Thursday for skin testing only. - in the meantime, try Dymista 1-2 sprays per nostril up to twice daily to control your postnasal drip.   2. Adverse food reaction (shrimp) - It is odd that the patient is able to tolerate the shrimp if it is adequately cooked. - Shrimp is a heat stable protein and is typically not broken down enough to be able to tolerate it. - There is the possibility that this might be related to cross reactivity between dust mites and shrimp via a common tropomyosin. - We will address this with testing at the next visit as well.  - EpiPen prescription deferred for now.   3. Shortness of breath - Pulmonary function testing is normal today.  - We deferred bronchodilator treatment since I feel that her symptoms are related to postnasal drip rather than intrinsic asthma. - We will revisit this in the future if indicated.   4. Return in about 3 days (around 02/19/2018).  Subjective:   Emma Larsen is a 47 y.o. female presenting today for evaluation of  Chief Complaint  Patient presents with  . Nasal Congestion  . Gastroesophageal Reflux  . sinus pressure    Emma Larsen has a history of the following: Patient Active Problem List   Diagnosis Date Noted  . SOB (shortness of breath) 02/16/2018  . Adverse food reaction 02/16/2018  . Chronic rhinitis 02/16/2018  .  Gastroesophageal reflux disease 02/16/2018    History obtained from: chart review and patient.  Matthew Folks was referred by Truman Hayward, FNP.     Emma Larsen is a 47 y.o. female presenting for an evaluation of shortness of breath with postnasal drip. She first had symptoms in December 2018 when she was diagnosed with sinusitis. She got an antibiotic and did not improve. She went to [facility] ED and she was felt to have acute bronchitis. This is when she was given the Ventolin inhaler. Ventolin did not seem to help at all. She has never been treated for asthma in the past, aside from "childhood asthma" that resolved over time.   Then she started having the sample symptoms including "air pressure" in her abdomen. She was then diagnosed with GERD. She was given GERD medications as well as Clarinex. She continued to have problem with phlegm. She had an endoscopy in February and had a balloon dilation. She did have biopsies that came back normal per the patient. She was noted to have a small kidney stone on the chest/abdominal CT sometime in February. She started all of her medications and she continued to have symptoms. She was started on amitriptyline a couple of months ago with some slight improvement in her reflux symptoms, but they have not gone away completely. She reports that she starts to have an "acid feel" with tons of phlegm.  She was noted to have continued marked  phelgm production despite the allergy and GERD medications started in the last four months.   She did see Dr. Willa RoughHicks within the last 5-10 years and shots were recommended. She does report an allergy to shellfish including hives and swelling. It has to be "cooked well" in order for her to tolerate it. But she does eat it at certain times without problems. She otherwise tolerates all of the major food allergens without adverse event.    Otherwise, there is no history of other atopic diseases, including drug allergies, stinging  insect allergies, or urticaria. There is no significant infectious history. Vaccinations are up to date.    Past Medical History: Patient Active Problem List   Diagnosis Date Noted  . SOB (shortness of breath) 02/16/2018  . Adverse food reaction 02/16/2018  . Chronic rhinitis 02/16/2018  . Gastroesophageal reflux disease 02/16/2018    Medication List:  Allergies as of 02/16/2018   No Known Allergies     Medication List        Accurate as of 02/16/18  1:01 PM. Always use your most recent med list.          amitriptyline 25 MG tablet Commonly known as:  ELAVIL Take by mouth.   desloratadine 5 MG tablet Commonly known as:  CLARINEX TK 1 T PO HS   pantoprazole 40 MG tablet Commonly known as:  PROTONIX   ranitidine 150 MG tablet Commonly known as:  ZANTAC TK 1 T PO BID       Birth History: non-contributory.   Developmental History: non-contributory.   Past Surgical History: Past Surgical History:  Procedure Laterality Date  . WISDOM TOOTH EXTRACTION       Family History: Family History  Problem Relation Age of Onset  . Hypertension Father   . Diabetes Mother   . Thyroid disease Mother   . Hypertension Mother   . Allergic rhinitis Mother   . Mental retardation Cousin   . Sickle cell trait Son   . Eczema Son   . Allergic rhinitis Sister   . Asthma Sister   . Food Allergy Sister        shellfish  . Eczema Daughter      Social History: Emma Larsen lives at home with her family. 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 She lives in a house that was built in 1989. There is carpeting and hardwoods in the main living areas. There is tile in the bedrooms. They have gas heating and central cooling. There is a dog and a turtle in the home. There are no dust mite coverings on the bedding. There is no tobacco exposure.     Review of Systems: a 14-point review of systems is pertinent for what is mentioned in HPI.  Otherwise, all other systems  were negative. Constitutional: negative other than that listed in the HPI Eyes: negative other than that listed in the HPI Ears, nose, mouth, throat, and face: negative other than that listed in the HPI Respiratory: negative other than that listed in the HPI Cardiovascular: negative other than that listed in the HPI Gastrointestinal: negative other than that listed in the HPI Genitourinary: negative other than that listed in the HPI Integument: negative other than that listed in the HPI Hematologic: negative other than that listed in the HPI Musculoskeletal: negative other than that listed in the HPI Neurological: negative other than that listed in the HPI Allergy/Immunologic: negative other than that listed in the HPI    Objective:  Blood pressure 110/80, pulse 62, temperature 98 F (36.7 C), temperature source Oral, resp. rate 16, height 5\' 2"  (1.575 m), weight 152 lb 3.2 oz (69 kg). Body mass index is 27.84 kg/m.   Physical Exam:  General: Alert, interactive, in no acute distress. Pleasant and interactive.  Eyes: No conjunctival injection bilaterally, no discharge on the right, no discharge on the left and no Horner-Trantas dots present. PERRL bilaterally. EOMI without pain. No photophobia.  Ears: Right TM pearly gray with normal light reflex, Left TM pearly gray with normal light reflex, Right TM intact without perforation and Left TM intact without perforation.  Nose/Throat: External nose within normal limits, nasal crease present and septum midline. Turbinates markedly edematous and pale with clear discharge. Posterior oropharynx markedly erythematous with cobblestoning in the posterior oropharynx. Tonsils 2+ without exudates.  Tongue without thrush. Neck: Supple without thyromegaly. Trachea midline. Adenopathy: no enlarged lymph nodes appreciated in the anterior cervical, occipital, axillary, epitrochlear, inguinal, or popliteal regions. Lungs: Clear to auscultation without  wheezing, rhonchi or rales. No increased work of breathing. CV: Normal S1/S2. No murmurs. Capillary refill <2 seconds.  Abdomen: Nondistended, nontender. No guarding or rebound tenderness. Bowel sounds present in all fields and hypoactive  Skin: Warm and dry, without lesions or rashes. Extremities:  No clubbing, cyanosis or edema. Neuro:   Grossly intact. No focal deficits appreciated. Responsive to questions.  Diagnostic studies:   Spirometry: results normal (FEV1: 2.24/101%, FVC: 2.65/97%, FEV1/FVC: 85%).    Spirometry consistent with normal pattern.   Allergy Studies: deferred due to recent antihistamine use (amitryptyline has a long half life and was likely masking the reaction to the positive control today)     Malachi Bonds, MD [facility]

## 2018-02-19 ENCOUNTER — Encounter: Payer: Self-pay | Admitting: Allergy & Immunology

## 2018-02-19 ENCOUNTER — Ambulatory Visit (INDEPENDENT_AMBULATORY_CARE_PROVIDER_SITE_OTHER): Payer: 59 | Admitting: Allergy & Immunology

## 2018-02-19 VITALS — BP 120/76 | HR 88 | Resp 20

## 2018-02-19 DIAGNOSIS — J302 Other seasonal allergic rhinitis: Secondary | ICD-10-CM | POA: Diagnosis not present

## 2018-02-19 DIAGNOSIS — J3089 Other allergic rhinitis: Secondary | ICD-10-CM

## 2018-02-19 DIAGNOSIS — T781XXD Other adverse food reactions, not elsewhere classified, subsequent encounter: Secondary | ICD-10-CM | POA: Diagnosis not present

## 2018-02-19 MED ORDER — EPINEPHRINE 0.3 MG/0.3ML IJ SOAJ: INTRAMUSCULAR | 1 refills | Status: DC

## 2018-02-19 MED ORDER — CARBINOXAMINE MALEATE 6 MG PO TABS: 6.0000 mg | ORAL_TABLET | Freq: Three times a day (TID) | ORAL | 5 refills | Status: DC | PRN

## 2018-02-19 MED ORDER — MONTELUKAST SODIUM 10 MG PO TABS: 10.0000 mg | ORAL_TABLET | Freq: Every day | ORAL | 5 refills | Status: DC

## 2018-02-19 MED ORDER — AZELASTINE-FLUTICASONE 137-50 MCG/ACT NA SUSP: NASAL | 5 refills | Status: DC

## 2018-02-19 NOTE — Patient Instructions (Addendum)
1. Seasonal and perennial allergic rhinitis - Testing today showed: trees, weeds, grasses, indoor molds, dust mites, cat, dog and cockroach - Avoidance measures provided. - Stop taking: Desloratadine - Start taking: Ryvent (carbinoxamine) 6mg  tablet 3-4 times daily as needed, Singulair (montelukast) 10mg  daily and Dymista (fluticasone/azelastine) two sprays per nostril 1-2 times daily as needed - You can use an extra dose of the antihistamine, if needed, for breakthrough symptoms.  - Consider nasal saline rinses 1-2 times daily to remove allergens from the nasal cavities as well as help with mucous clearance (this is especially helpful to do before the nasal sprays are given) - Consider allergy shots as a means of long-term control. - Allergy shots "re-train" and "reset" the immune system to ignore environmental allergens and decrease the resulting immune response to those allergens (sneezing, itchy watery eyes, runny nose, nasal congestion, etc).    - Allergy shots improve symptoms in 75-85% of patients.  - Call your insurance company to check on any co-payments ane call us back when you make a decision.  2. Adverse food reaction, subsequent encounter - Testing was negative to: Peanut, Soy, Wheat, Sesame, Milk, Egg, Casein, Shellfish Mix , Fish Mix, Cashew, [place], [place], [place], [place], [place] nut, [place], [place], [place], [place], [place], [place] and [place] - We will get a seafood panel to confirm these findings.  - Training for epinephrine auto-injectors provided: AuviQ - There is a the low positive predictive value of food allergy testing and hence the high possibility of false positives. - In contrast, food allergy testing has a high negative predictive value, therefore if testing is negative we can be relatively assured that they are indeed negative.   3. Return in about 3 months (around 05/21/2018).   Please inform us of any Emergency Department visits, hospitalizations, or changes in  symptoms. Call us before going to the ED for breathing or allergy symptoms since we might be able to fit you in for a sick visit. Feel free to contact us anytime with any questions, problems, or concerns.  It was a pleasure to see you again today!  Websites that have reliable patient information: 1. American Academy of Asthma, Allergy, and Immunology: www.aaaai.org 2. Food Allergy Research and Education (FARE): foodallergy.org 3. Mothers of Asthmatics: http://www.asthmacommunitynetwork.org 4. American College of Allergy, Asthma, and Immunology: www.acaai.org   Reducing Pollen Exposure  The American Academy of Allergy, Asthma and Immunology suggests the following steps to reduce your exposure to pollen during allergy seasons.    1. Do not hang sheets or clothing out to dry; pollen may collect on these items. 2. Do not mow lawns or spend time around freshly cut grass; mowing stirs up pollen. 3. Keep windows closed at night.  Keep car windows closed while driving. 4. Minimize morning activities outdoors, a time when pollen counts are usually at their highest. 5. Stay indoors as much as possible when pollen counts or humidity is high and on windy days when pollen tends to remain in the air longer. 6. Use air conditioning when possible.  Many air conditioners have filters that trap the pollen spores. 7. Use a HEPA room air filter to remove pollen form the indoor air you breathe.  Control of Mold Allergen   Mold and fungi can grow on a variety of surfaces provided certain temperature and moisture conditions exist.  Outdoor molds grow on plants, decaying vegetation and soil.  The major outdoor mold, Alternaria and Cladosporium, are found in very high numbers during hot and dry conditions.  Generally, a late Summer - Fall peak is seen for common outdoor fungal spores.  Rain will temporarily lower outdoor mold spore count, but counts rise rapidly when the rainy period ends.  The most important indoor  molds are Aspergillus and Penicillium.  Dark, humid and poorly ventilated basements are ideal sites for mold growth.  The next most common sites of mold growth are the bathroom and the kitchen.     Indoor (Perennial) Mold Control   Positive indoor molds via skin testing: Aspergillus and Penicillium  1. Maintain humidity below 50%. 2. Clean washable surfaces with 5% bleach solution. 3. Remove sources e.g. contaminated carpets.   Control of Cockroach Allergen  Cockroach allergen has been identified as an important cause of acute attacks of asthma, especially in urban settings.  There are fifty-five species of cockroach that exist in the [place], however only three, the [place], [place] species produce allergen that can affect patients with Asthma.  Allergens can be obtained from fecal particles, egg casings and secretions from cockroaches.    1. Remove food sources. 2. Reduce access to water. 3. Seal access and entry points. 4. Spray runways with 0.5-1% Diazinon or Chlorpyrifos 5. Blow boric acid power under stoves and refrigerator. 6. Place bait stations (hydramethylnon) at feeding sites.    Control of House Dust Mite Allergen    House dust mites play a major role in allergic asthma and rhinitis.  They occur in environments with high humidity wherever human skin, the food for dust mites is found. High levels have been detected in dust obtained from mattresses, pillows, carpets, upholstered furniture, bed covers, clothes and soft toys.  The principal allergen of the house dust mite is found in its feces.  A gram of dust may contain 1,000 mites and 250,000 fecal particles.  Mite antigen is easily measured in the air during house cleaning activities.    1. Encase mattresses, including the box spring, and pillow, in an air tight cover.  Seal the zipper end of the encased mattresses with wide adhesive tape. 2. Wash the bedding in water of 130 degrees Farenheit weekly.   Avoid cotton comforters/quilts and flannel bedding: the most ideal bed covering is the dacron comforter. 3. Remove all upholstered furniture from the bedroom. 4. Remove carpets, carpet padding, rugs, and non-washable window drapes from the bedroom.  Wash drapes weekly or use plastic window coverings. 5. Remove all non-washable stuffed toys from the bedroom.  Wash stuffed toys weekly. 6. Have the room cleaned frequently with a vacuum cleaner and a damp dust-mop.  The patient should not be in a room which is being cleaned and should wait 1 hour after cleaning before going into the room. 7. Close and seal all heating outlets in the bedroom.  Otherwise, the room will become filled with dust-laden air.  An electric heater can be used to heat the room. 8. Reduce indoor humidity to less than 50%.  Do not use a humidifier.  Control of Dog or Cat Allergen  Avoidance is the best way to manage a dog or cat allergy. If you have a dog or cat and are allergic to dog or cats, consider removing the dog or cat from the home. If you have a dog or cat but don't want to find it a new home, or if your family wants a pet even though someone in the household is allergic, here are some strategies that may help keep symptoms at bay:  1. Keep the pet  out of your bedroom and restrict it to only a few rooms. Be advised that keeping the dog or cat in only one room will not limit the allergens to that room. 2. Don't pet, hug or kiss the dog or cat; if you do, wash your hands with soap and water. 3. High-efficiency particulate air (HEPA) cleaners run continuously in a bedroom or living room can reduce allergen levels over time. 4. Regular use of a high-efficiency vacuum cleaner or a central vacuum can reduce allergen levels. 5. Giving your dog or cat a bath at least once a week can reduce airborne allergen.  Allergy Shots   Allergies are the result of a chain reaction that starts in the immune system. Your immune system  controls how your body defends itself. For instance, if you have an allergy to pollen, your immune system identifies pollen as an invader or allergen. Your immune system overreacts by producing antibodies called Immunoglobulin E (IgE). These antibodies travel to cells that release chemicals, causing an allergic reaction.  The concept behind allergy immunotherapy, whether it is received in the form of shots or tablets, is that the immune system can be desensitized to specific allergens that trigger allergy symptoms. Although it requires time and patience, the payback can be long-term relief.  How Do Allergy Shots Work?  Allergy shots work much like a vaccine. Your body responds to injected amounts of a particular allergen given in increasing doses, eventually developing a resistance and tolerance to it. Allergy shots can lead to decreased, minimal or no allergy symptoms.  There generally are two phases: build-up and maintenance. Build-up often ranges from three to six months and involves receiving injections with increasing amounts of the allergens. The shots are typically given once or twice a week, though more rapid build-up schedules are sometimes used.  The maintenance phase begins when the most effective dose is reached. This dose is different for each person, depending on how allergic you are and your response to the build-up injections. Once the maintenance dose is reached, there are longer periods between injections, typically two to four weeks.  Occasionally doctors give cortisone-type shots that can temporarily reduce allergy symptoms. These types of shots are different and should not be confused with allergy immunotherapy shots.  Who Can Be Treated with Allergy Shots?  Allergy shots may be a good treatment approach for people with allergic rhinitis (hay fever), allergic asthma, conjunctivitis (eye allergy) or stinging insect allergy.   Before deciding to begin allergy shots, you should  consider:  . The length of allergy season and the severity of your symptoms . Whether medications and/or changes to your environment can control your symptoms . Your desire to avoid long-term medication use . Time: allergy immunotherapy requires a major time commitment . Cost: may vary depending on your insurance coverage  Allergy shots for children age 74 and older are effective and often well tolerated. They might prevent the onset of new allergen sensitivities or the progression to asthma.  Allergy shots are not started on patients who are pregnant but can be continued on patients who become pregnant while receiving them. In some patients with other medical conditions or who take certain common medications, allergy shots may be of risk. It is important to mention other medications you talk to your allergist.   When Will I Feel Better?  Some may experience decreased allergy symptoms during the build-up phase. For others, it may take as long as 12 months on the maintenance dose. If  there is no improvement after a year of maintenance, your allergist will discuss other treatment options with you.  If you aren't responding to allergy shots, it may be because there is not enough dose of the allergen in your vaccine or there are missing allergens that were not identified during your allergy testing. Other reasons could be that there are high levels of the allergen in your environment or major exposure to non-allergic triggers like tobacco smoke.  What Is the Length of Treatment?  Once the maintenance dose is reached, allergy shots are generally continued for three to five years. The decision to stop should be discussed with your allergist at that time. Some people may experience a permanent reduction of allergy symptoms. Others may relapse and a longer course of allergy shots can be considered.  What Are the Possible Reactions?  The two types of adverse reactions that can occur with allergy  shots are local and systemic. Common local reactions include very mild redness and swelling at the injection site, which can happen immediately or several hours after. A systemic reaction, which is less common, affects the entire body or a particular body system. They are usually mild and typically respond quickly to medications. Signs include increased allergy symptoms such as sneezing, a stuffy nose or hives.  Rarely, a serious systemic reaction called anaphylaxis can develop. Symptoms include swelling in the throat, wheezing, a feeling of tightness in the chest, nausea or dizziness. Most serious systemic reactions develop within 30 minutes of allergy shots. This is why it is strongly recommended you wait in your doctor's office for 30 minutes after your injections. Your allergist is trained to watch for reactions, and his or her staff is trained and equipped with the proper medications to identify and treat them.  Who Should Administer Allergy Shots?  The preferred location for receiving shots is your prescribing allergist's office. Injections can sometimes be given at another facility where the physician and staff are trained to recognize and treat reactions, and have received instructions by your prescribing allergist.

## 2018-02-19 NOTE — Progress Notes (Signed)
FOLLOW UP  Date of Service/Encounter:  02/19/18   Assessment:   Seasonal and perennial allergic rhinitis (trees, weeds, grasses, indoor molds, dust mites, cat, dog and cockroach)  Adverse food reaction (seafood) - with negative skin testing today  Plan/Recommendations:   1. Seasonal and perennial allergic rhinitis - Testing today showed: trees, weeds, grasses, indoor molds, dust mites, cat, dog and cockroach - Avoidance measures provided. - Stop taking: Desloratadine - Start taking: Ryvent (carbinoxamine) 6mg  tablet 3-4 times daily as needed, Singulair (montelukast) 10mg  daily and Dymista (fluticasone/azelastine) two sprays per nostril 1-2 times daily as needed - You can use an extra dose of the antihistamine, if needed, for breakthrough symptoms.  - Consider nasal saline rinses 1-2 times daily to remove allergens from the nasal cavities as well as help with mucous clearance (this is especially helpful to do before the nasal sprays are given) - Consider allergy shots as a means of long-term control. - Allergy shots "re-train" and "reset" the immune system to ignore environmental allergens and decrease the resulting immune response to those allergens (sneezing, itchy watery eyes, runny nose, nasal congestion, etc).    - Allergy shots improve symptoms in 75-85% of patients.  - Call your insurance company to check on any co-payments ane call us back when you make a decision.  2. Adverse food reaction - Testing was negative to: Peanut, Soy, Wheat, Sesame, Milk, Egg, Casein, Shellfish Mix , Fish Mix, Cashew, [place], [place], [place], [place], [place] nut, [place], [place], [place], [place], [place], [place] and [place] - We will get a seafood panel to confirm these findings.  - Training for epinephrine auto-injectors provided: AuviQ - There is a the low positive predictive value of food allergy testing and hence the high possibility of false positives. - In contrast, food allergy testing has a  high negative predictive value, therefore if testing is negative we can be relatively assured that they are indeed negative.   3. Return in about 3 months (around 05/21/2018).   Subjective:   Emma Larsen is a 47 y.o. female presenting today for follow up of  Chief Complaint  Patient presents with  . Urticaria  . Allergy Testing    Emma Larsen has a history of the following: Patient Active Problem List   Diagnosis Date Noted  . SOB (shortness of breath) 02/16/2018  . Adverse food reaction 02/16/2018  . Chronic rhinitis 02/16/2018  . Gastroesophageal reflux disease 02/16/2018    History obtained from: chart review and patient.  Emma Larsen [facility] Primary Care Provider is Truman Hayward, FNP.     Emma Larsen is a 47 y.o. female presenting for a skin testing visit. 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 She was on amitriptyline at the time, which I felt was resulting in a non-reactive histamine. Therefore she presents today for skin testing. For her rhinitis, we started her on Dymista 1-2 sprays per nostril daily. 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 Spirometry was normal and we felt that her symptoms were more related to postnasal drip rather than true asthma. Therefore we did not start an asthma medication.   Otherwise, there have been no changes to her past medical history, surgical history, family history, or social history.    Review of Systems: a 14-point review of systems is pertinent for what is mentioned in HPI.  Otherwise, all other systems were negative. Constitutional: negative other than that listed in  the HPI Eyes: negative other than that listed in the HPI Ears, nose, mouth, throat, and face: negative other than that listed in the HPI Respiratory: negative other than that listed in the HPI Cardiovascular: negative other than that listed in the  HPI Gastrointestinal: negative other than that listed in the HPI Genitourinary: negative other than that listed in the HPI Integument: negative other than that listed in the HPI Hematologic: negative other than that listed in the HPI Musculoskeletal: negative other than that listed in the HPI Neurological: negative other than that listed in the HPI Allergy/Immunologic: negative other than that listed in the HPI    Objective:   Blood pressure 120/76, pulse 88, resp. rate 20. There is no height or weight on file to calculate BMI.   Physical Exam: deferred since this was a skin testing appointment only.    Diagnostic studies:   Allergy Studies:   Indoor/Outdoor Percutaneous Adult Environmental Panel: positive to bahia grass, [place] grass, johnson grass, [place] blue grass, meadow fescue grass, perennial rye grass, sweet vernal grass, timothy grass, cocklebur, burweed marsh elder, short ragweed, giant ragweed, English plantain, lamb's quarters, sheep sorrel, rough pigweed, rough marsh elder, Box elder, red cedar, pecan pollen, [place], Df mite, Dp mites, cat, dog and horse. Otherwise negative with adequate controls.  Indoor/Outdoor Selected Intradermal Environmental Panel: positive to mold mix #2 and cockroach. Otherwise negative with adequate controls.  Selected Food Panel: negative to Peanut, Soy, Wheat, Sesame, Milk, Egg, Casein, Shellfish Mix , Fish Mix, Cashew, [place], [place], [place], [place], [place] nut, [place], [place], [place], [place], [place]ob[place], [place] and [place] with adequate controls.     Allergy testing results were read and interpreted by myself, documented by clinical staff.      Malachi BondsJoel Damarion Mendizabal, MD FAAAAI [facility]

## 2018-02-19 NOTE — Addendum Note (Signed)
Addended by: Mliss FritzBLACK, Marialy Urbanczyk I on: 02/19/2018 07:29 AM   Modules accepted: Orders

## 2018-02-25 ENCOUNTER — Ambulatory Visit: Payer: Self-pay | Admitting: Allergy and Immunology

## 2018-02-25 ENCOUNTER — Telehealth: Payer: Self-pay

## 2018-02-25 LAB — ALLERGEN PROFILE, SHELLFISH
Clam IgE: <0.1 kU/L
F080-IgE Lobster: <0.1 kU/L
F290-IgE Oyster: <0.1 kU/L
SHRIMP IGE: 4.53 kU/L — AB
Scallop IgE: <0.1 kU/L

## 2018-02-25 LAB — TRYPTASE: Tryptase: 4.8 ug/L (ref 2.2–13.2)

## 2018-02-25 MED ORDER — MONTELUKAST SODIUM 10 MG PO TABS: 10.0000 mg | ORAL_TABLET | Freq: Every day | ORAL | 5 refills | Status: DC

## 2018-02-25 MED ORDER — CARBINOXAMINE MALEATE 6 MG PO TABS: 6.0000 mg | ORAL_TABLET | Freq: Three times a day (TID) | ORAL | 5 refills | Status: DC | PRN

## 2018-02-25 MED ORDER — AZELASTINE-FLUTICASONE 137-50 MCG/ACT NA SUSP: NASAL | 5 refills | Status: DC

## 2018-02-25 MED ORDER — EPINEPHRINE 0.3 MG/0.3ML IJ SOAJ: INTRAMUSCULAR | 1 refills | Status: DC

## 2018-02-25 NOTE — Telephone Encounter (Signed)
Called to inform patient of lab results and she informed me that her medications were not sent in however after looking they were sent in just to the wrong pharmacy. RX have been sent to right pharmacy.

## 2018-05-21 ENCOUNTER — Ambulatory Visit: Payer: 59 | Admitting: Allergy & Immunology

## 2018-05-27 ENCOUNTER — Encounter: Payer: Self-pay | Admitting: Pulmonary Disease

## 2018-05-27 ENCOUNTER — Ambulatory Visit (INDEPENDENT_AMBULATORY_CARE_PROVIDER_SITE_OTHER)
Admission: RE | Admit: 2018-05-27 | Discharge: 2018-05-27 | Disposition: A | Discharge status: Home / Self Care | Payer: 59 | Source: Ambulatory Visit | Attending: Pulmonary Disease | Admitting: Pulmonary Disease

## 2018-05-27 ENCOUNTER — Ambulatory Visit: Payer: 59 | Admitting: Pulmonary Disease

## 2018-05-27 VITALS — BP 142/98 | HR 107 | Ht 62.0 in | Wt 158.0 lb

## 2018-05-27 DIAGNOSIS — R059 Cough, unspecified: Secondary | ICD-10-CM

## 2018-05-27 DIAGNOSIS — R05 Cough: Secondary | ICD-10-CM

## 2018-05-27 DIAGNOSIS — J42 Unspecified chronic bronchitis: Secondary | ICD-10-CM | POA: Diagnosis not present

## 2018-05-27 MED ORDER — BENZONATATE 100 MG PO CAPS: 100.0000 mg | ORAL_CAPSULE | Freq: Four times a day (QID) | ORAL | 1 refills | Status: DC | PRN

## 2018-05-27 MED ORDER — PREDNISONE 10 MG PO TABS
10.0000 mg | ORAL_TABLET | Freq: Every day | ORAL | 0 refills | Status: DC
Start: 2018-05-27 — End: 2018-06-03

## 2018-05-27 MED ORDER — DOXYCYCLINE HYCLATE 100 MG PO TABS: 100.0000 mg | ORAL_TABLET | Freq: Two times a day (BID) | ORAL | 0 refills | Status: DC
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
## 2018-05-27 NOTE — Patient Instructions (Signed)
Persistent cough-may be related to chronic bronchitis Evaluated by GI with no significant findings-did have mild stricture that was managed by dilatation  With persistence of symptoms, possible chronic bronchitis, no previous history of significant lung disease, no significant risk profile  Empiric treatment for bronchitis  Doxycycline 100 p.o. twice daily for 7 days  Short course of prednisone 10 p.o. twice daily  Course of benzonatate to be used as needed  Obtain chest x-ray  Obtain pulmonary function study  Patient to call us to let us know how symptoms are in the next couple of weeks  Did discuss possibility of obtaining a CT scan of the chest if persistence of symptoms or nonresolution of symptoms  Bronchoscopy as an evaluation was discussed--if not resolution of symptoms  Tentatively, see patient back in 3 months

## 2018-05-28 ENCOUNTER — Encounter: Payer: Self-pay | Admitting: Pulmonary Disease

## 2018-05-28 ENCOUNTER — Institutional Professional Consult (permissible substitution): Payer: 59 | Admitting: Internal Medicine

## 2018-05-29 ENCOUNTER — Telehealth: Payer: Self-pay | Admitting: Pulmonary Disease

## 2018-05-29 NOTE — Telephone Encounter (Signed)
Spoke with pt. She is requesting her CXR results.  Dr. Wynona Neatlalere - please advise. Thanks.

## 2018-05-30 ENCOUNTER — Encounter: Payer: Self-pay | Admitting: Pulmonary Disease

## 2018-06-01 NOTE — Telephone Encounter (Signed)
Patient called back wanting results - she can be reached at [phone]-pr

## 2018-06-01 NOTE — Telephone Encounter (Signed)
Called and spoke to pt. Pt is requesting CXR results and what next steps are if CXR is normal. Pt is aware that Dr. Wynona Neatlalere is not in the office this week.   Dr. Wynona Neatlalere please advise. Thanks.

## 2018-06-02 ENCOUNTER — Telehealth: Payer: Self-pay | Admitting: Pulmonary Disease

## 2018-06-02 NOTE — Telephone Encounter (Signed)
Returned call to Patient.  She stated that she has 1 more day of Doxycycline left.  She saw Dr. Wynona Neatlalere 05/27/18.  She is still coughing, has lots of phlegm still coming up.  She feels that she needs another antibiotic or some other treatment.  Appointment made with Ames DuraElizabeth Walsh, NP, 06/03/18 at 0930.

## 2018-06-03 ENCOUNTER — Ambulatory Visit (INDEPENDENT_AMBULATORY_CARE_PROVIDER_SITE_OTHER): Payer: 59 | Admitting: Primary Care

## 2018-06-03 ENCOUNTER — Encounter: Payer: Self-pay | Admitting: Primary Care

## 2018-06-03 VITALS — BP 140/88 | HR 89 | Ht 62.0 in | Wt 160.2 lb

## 2018-06-03 DIAGNOSIS — J42 Unspecified chronic bronchitis: Secondary | ICD-10-CM

## 2018-06-03 DIAGNOSIS — J310 Chronic rhinitis: Secondary | ICD-10-CM | POA: Diagnosis not present

## 2018-06-03 DIAGNOSIS — J410 Simple chronic bronchitis: Secondary | ICD-10-CM | POA: Diagnosis not present

## 2018-06-03 LAB — NITRIC OXIDE: 11800305105: 14

## 2018-06-03 MED ORDER — MOMETASONE FUROATE 220 MCG/INH IN AEPB: 1.0000 | INHALATION_SPRAY | Freq: Two times a day (BID) | RESPIRATORY_TRACT | 0 refills | Status: DC

## 2018-06-03 MED ORDER — HYDROCODONE-HOMATROPINE 5-1.5 MG/5ML PO SYRP: 5.0000 mL | ORAL_SOLUTION | Freq: Every evening | ORAL | 0 refills | Status: DC | PRN

## 2018-06-03 MED ORDER — FLUTTER DEVI: 0 refills | Status: DC

## 2018-06-03 MED ORDER — AEROCHAMBER MV MISC: 0 refills | Status: DC

## 2018-06-03 NOTE — Telephone Encounter (Signed)
Pt is requesting CXR results and what next steps are if CXR is normal.   Dr. Wynona Neatlalere please advise. Thanks.
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
## 2018-06-03 NOTE — Assessment & Plan Note (Addendum)
-   Not taking singulair, reports that she is taking an antihistamine   - Add back flonase or dymista (given saline nasal mist to moisturize and sooth)

## 2018-06-03 NOTE — Telephone Encounter (Signed)
Called and spoke with patient regarding results.  Informed the patient of results and recommendations today. Pt verbalized understanding and denied any questions or concerns at this time.  Nothing further needed.  

## 2018-06-03 NOTE — Patient Instructions (Addendum)
Follow up in 2 weeks with Dr. Wynona Neatlalere  -Use flutter valve three times a day (5-10 puffs) -Trial Asmanex 1 puff BID -Ventolin is your rescue inhaler- take as needed for shortness of breath/wheezing (use spacer) -Prescription cough medication at bedtime -Check sputum culture today   Restart: -Flonase or dymista daily -Mucinex twice a day with full glass of water -Delsym cough syrup twice daily

## 2018-06-03 NOTE — Assessment & Plan Note (Signed)
Testing: - Repeat spirometry today- FVC 2.72 (99%), FEV1 2.31 (104%), ratio 85 (103%). FENO 14. Previous CXR normal.   Assessment  - No evidence of asthma or COPD or bacterial infection. Likely chronic bronchitis d/t post nasal drip or GERD     Plan: - Trial asmanex 1 puff BID, rescue inhaler for sob/wheezing - Add mucinex BID, delsym OTC and prescription cough medication at night - Use flutter valve three times a day - Check sputum culture   Follow up: - 2 weeks with Emma Larsen, consider CT scan at that time if no improvement

## 2018-06-04 ENCOUNTER — Other Ambulatory Visit: Payer: 59

## 2018-06-04 DIAGNOSIS — J42 Unspecified chronic bronchitis: Secondary | ICD-10-CM

## 2018-06-06 LAB — RESPIRATORY CULTURE OR RESPIRATORY AND SPUTUM CULTURE
MICRO NUMBER:: 90881278
RESULT:: NORMAL
SPECIMEN QUALITY:: ADEQUATE

## 2018-06-08 ENCOUNTER — Telehealth: Payer: Self-pay | Admitting: Primary Care

## 2018-06-08 NOTE — Telephone Encounter (Signed)
Sputum culture is normal. Continue previous recommendations and keep fu in 2 weeks. Thanks!

## 2018-06-08 NOTE — Telephone Encounter (Signed)
Spoke with patient. She is aware of results and recommendations. Nothing else needed at time of call.

## 2018-06-08 NOTE — Telephone Encounter (Signed)
Spoke with patient. She stated that she dropped off a sputum sample on 06/04/18. She is requesting these results. Results are in Epic but have not been reviewed.   Beth, please advise. Thanks!

## 2018-06-17 ENCOUNTER — Ambulatory Visit: Payer: 59 | Admitting: Pulmonary Disease

## 2018-06-17 ENCOUNTER — Encounter: Payer: Self-pay | Admitting: Pulmonary Disease

## 2018-06-17 DIAGNOSIS — R05 Cough: Secondary | ICD-10-CM | POA: Diagnosis not present

## 2018-06-17 DIAGNOSIS — R059 Cough, unspecified: Secondary | ICD-10-CM

## 2018-06-17 MED ORDER — GABAPENTIN 300 MG PO CAPS: 300.0000 mg | ORAL_CAPSULE | Freq: Every day | ORAL | 0 refills | Status: DC

## 2018-06-17 NOTE — Patient Instructions (Signed)
Persistent, unresolving cough  Medications for GERD, rhinitis, postnasal drip have not really helped   Obtain CT scan of the chest without contrast  Tentative bronchoscopy for next week  Trial with Neurontin at a low dose
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
## 2018-06-23 ENCOUNTER — Ambulatory Visit (INDEPENDENT_AMBULATORY_CARE_PROVIDER_SITE_OTHER)
Admission: RE | Admit: 2018-06-23 | Discharge: 2018-06-23 | Disposition: A | Discharge status: Home / Self Care | Payer: 59 | Source: Ambulatory Visit | Attending: Pulmonary Disease | Admitting: Pulmonary Disease

## 2018-06-23 DIAGNOSIS — R05 Cough: Secondary | ICD-10-CM | POA: Diagnosis not present

## 2018-06-23 DIAGNOSIS — R059 Cough, unspecified: Secondary | ICD-10-CM

## 2018-06-24 ENCOUNTER — Telehealth: Payer: Self-pay | Admitting: Pulmonary Disease

## 2018-06-24 NOTE — Telephone Encounter (Signed)
Yes, we can go ahead with bronchoscopy

## 2018-06-24 NOTE — Telephone Encounter (Signed)
Bronchoscopy not needed if symptoms are getting better  It is only to further evaluate persistence of symptoms despite normal radiological studies  So if symptoms are better, we can hold off on bronchoscopy  If she decides not to have bronchoscopy-we need to cancel-set up for 21st tentatively

## 2018-06-24 NOTE — Telephone Encounter (Signed)
ATC pt, no answer. Left message for pt to call back.  

## 2018-06-24 NOTE — Telephone Encounter (Signed)
Advised pt of results. Pt understood. She wants to know if the bronchoscopy is still necessary and if so, what could it reveal that the CT didn't? Please advise.

## 2018-06-24 NOTE — Telephone Encounter (Signed)
Called and spoke with patient regarding OL recommendations Pt advised that she would like to keep her bronchoscopy for next week as symptoms are still there for her. Advised pt will route message to OL for review.  OL please advise if you are okay with patient still having bronch next week.

## 2018-06-24 NOTE — Telephone Encounter (Signed)
Pt is requesting CT results from 06/23/18.  Dr. Wynona Neatlalere please advise. Thanks

## 2018-06-26 NOTE — Telephone Encounter (Signed)
Called and spoke with pt regarding OL is good with con't with bronchoscopy on 07/01/2018 Pt verbalized understanding, and had no further concerns Nothing further needed at this time.

## 2018-07-01 ENCOUNTER — Telehealth: Payer: Self-pay | Admitting: Pulmonary Disease

## 2018-07-01 ENCOUNTER — Ambulatory Visit (HOSPITAL_COMMUNITY)
Admission: RE | Admit: 2018-07-01 | Discharge: 2018-07-01 | Disposition: A | Discharge status: Home / Self Care | Payer: 59 | Source: Ambulatory Visit | Attending: Pulmonary Disease | Admitting: Pulmonary Disease

## 2018-07-01 ENCOUNTER — Encounter (HOSPITAL_COMMUNITY)
Admission: RE | Disposition: A | Discharge status: Home / Self Care | Payer: Self-pay | Source: Ambulatory Visit | Attending: Pulmonary Disease

## 2018-07-01 ENCOUNTER — Encounter (HOSPITAL_COMMUNITY): Payer: Self-pay | Admitting: Respiratory Therapy

## 2018-07-01 DIAGNOSIS — K219 Gastro-esophageal reflux disease without esophagitis: Secondary | ICD-10-CM | POA: Diagnosis not present

## 2018-07-01 DIAGNOSIS — Z79899 Other long term (current) drug therapy: Secondary | ICD-10-CM | POA: Insufficient documentation

## 2018-07-01 DIAGNOSIS — R0602 Shortness of breath: Secondary | ICD-10-CM | POA: Diagnosis not present

## 2018-07-01 DIAGNOSIS — R05 Cough: Secondary | ICD-10-CM | POA: Insufficient documentation

## 2018-07-01 DIAGNOSIS — J45909 Unspecified asthma, uncomplicated: Secondary | ICD-10-CM | POA: Diagnosis not present

## 2018-07-01 DIAGNOSIS — R0600 Dyspnea, unspecified: Secondary | ICD-10-CM

## 2018-07-01 HISTORY — PX: VIDEO BRONCHOSCOPY: SHX5072

## 2018-07-01 LAB — BODY FLUID CELL COUNT WITH DIFFERENTIAL
Eos, Fluid: 3 %
LYMPHS FL: 29 %
Monocyte-Macrophage-Serous Fluid: 31 % — ABNORMAL LOW (ref 50–90)
NEUTROPHIL FLUID: 37 % — AB (ref 0–25)
WBC FLUID: 19 uL (ref 0–1000)

## 2018-07-01 SURGERY — VIDEO BRONCHOSCOPY WITHOUT FLUORO
Anesthesia: Moderate Sedation | Laterality: Bilateral

## 2018-07-01 MED ORDER — MIDAZOLAM HCL 5 MG/ML IJ SOLN
INTRAMUSCULAR | Status: AC
  Filled 2018-07-01: qty 2

## 2018-07-01 MED ORDER — SODIUM CHLORIDE 0.9 % IV SOLN: INTRAVENOUS | Status: DC

## 2018-07-01 MED ORDER — FENTANYL CITRATE (PF) 100 MCG/2ML IJ SOLN
INTRAMUSCULAR | Status: AC
  Filled 2018-07-01: qty 4

## 2018-07-01 MED ORDER — PHENYLEPHRINE HCL 0.25 % NA SOLN
NASAL | Status: DC | PRN
  Administered 2018-07-01: 2 via NASAL

## 2018-07-01 MED ORDER — LIDOCAINE HCL 1 % IJ SOLN
INTRAMUSCULAR | Status: DC | PRN
  Administered 2018-07-01: 6 mL via RESPIRATORY_TRACT

## 2018-07-01 MED ORDER — SODIUM CHLORIDE 0.9 % IV SOLN
INTRAVENOUS | Status: DC
  Administered 2018-07-01: 07:00:00 via INTRAVENOUS

## 2018-07-01 MED ORDER — PHENYLEPHRINE HCL 0.25 % NA SOLN: 1.0000 | Freq: Four times a day (QID) | NASAL | Status: DC | PRN

## 2018-07-01 MED ORDER — FENTANYL CITRATE (PF) 100 MCG/2ML IJ SOLN
INTRAMUSCULAR | Status: DC | PRN
  Administered 2018-07-01 (×2): 50 ug via INTRAVENOUS

## 2018-07-01 MED ORDER — MIDAZOLAM HCL 10 MG/2ML IJ SOLN
INTRAMUSCULAR | Status: DC | PRN
  Administered 2018-07-01 (×3): 2 mg via INTRAVENOUS

## 2018-07-01 MED ORDER — LIDOCAINE HCL 2 % EX GEL
1.0000 "application " | Freq: Once | CUTANEOUS | Status: DC
  Filled 2018-07-01: qty 5

## 2018-07-01 MED ORDER — LIDOCAINE HCL URETHRAL/MUCOSAL 2 % EX GEL
CUTANEOUS | Status: DC | PRN
  Administered 2018-07-01: 1

## 2018-07-01 NOTE — Discharge Instructions (Signed)
Flexible Bronchoscopy, Care After These instructions give you information on caring for yourself after your procedure. Your doctor may also give you more specific instructions. Call your doctor if you have any problems or questions after your procedure. Follow these instructions at home:  Do not eat or drink anything for 2 hours after your procedure. If you try to eat or drink before the medicine wears off, food or drink could go into your lungs. You could also burn yourself.  After 2 hours have passed and when you can cough and gag normally, you may eat soft food and drink liquids slowly.  The day after the test, you may eat your normal diet.  You may do your normal activities.  Keep all doctor visits. Get help right away if:  You get more and more short of breath.  You get light-headed.  You feel like you are going to pass out (faint).  You have chest pain.  You have new problems that worry you.  You cough up more than a little blood.  You cough up more blood than before. This information is not intended to replace advice given to you by your health care provider. Make sure you discuss any questions you have with your health care provider. Document Released: 08/25/2009 Document Revised: 04/04/2016 Document Reviewed: 07/02/2013 Elsevier Interactive Patient Education  2017 [place].  Nothing to eat or drink until 10:00 am today    07/01/2018 Any Question or concerns please call the office at [phone]
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
## 2018-07-01 NOTE — Telephone Encounter (Signed)
Called and spoke with patient, she is aware. Verbalized understanding. Nothing further needed.

## 2018-07-01 NOTE — Brief Op Note (Signed)
Bronchoscopy Procedure Note  Date of Operation: 07/01/2018   Pre-op Diagnosis: Persistent cough Post-op Diagnosis: Persistent cough  Surgeon: Sherrilyn Rist   Anesthesia: Monitored Local Anesthesia with Sedation Time Started: 0 749 Time Stopped: 0804  Received 6 mg of Versed Received 100 mcg of fentanyl  Operation: Video Flexible fiberoptic bronchoscopy, diagnostic   Findings:  Normal airway Thin secretions   Specimen: Lavage right lower lobe, instilled 100 cc Retrieved about 30 cc  Estimated Blood Loss: No blood loss  Complications: No complications  Indications and History: See updated H and P same date. The risks, benefits, complications, treatment options and expected outcomes were discussed with the patient.  The possibilities of reaction to medication, pulmonary aspiration, perforation of a viscus, bleeding, failure to diagnose a condition and creating a complication requiring transfusion or operation were discussed with the patient who freely signed the consent.    Description of Procedure: The patient was re-examined in the bronchoscopy suite and the site of surgery properly noted/marked.  The patient was identified  and the procedure verified as Flexible Fiberoptic Bronchoscopy.  A Time Out was held and the above information confirmed.   After the induction of topical nasopharyngeal anesthesia, the patient was positioned  and the bronchoscope was passed through the right naris. The vocal cords were visualized and  1% buffered lidocaine 5 ml was topically placed onto the cords. The cords were visualized and normal. The scope was then passed into the trachea.  1% buffered lidocaine given topically. Airways inspected bilaterally to the subsegmental level with the following findings: Normal airway left lung was examined left upper lobe, lingula, lower lobe bronchi all visualized with no lesions Scope was withdrawn and introduced into the right lung right upper lobe right  middle lobe right lower lobe bronchi were all visualized with no lesions Bronchoalveolar lavage was performed in the right lower lobe-with adequate return of effluent    Interventions: Broncho alveolar lavage  Tolerated procedure well     The Patient was taken to the Endoscopy Recovery area in satisfactory condition.  Attestation: I performed the procedure.  Sherrilyn Rist, MD

## 2018-07-01 NOTE — Progress Notes (Signed)
Video Bronchoscopy done  Intervention Bronchial washing done 

## 2018-07-01 NOTE — Telephone Encounter (Signed)
Called and spoke with patient, she states that she had her bronch done earlier this morning and she has some questions/concerns.   Patient was wanting to know what the next step was for her. Does she need to do anything different. She is also wanting to know if she needs to take the ranitidine and pantoprazole.   Dr. Wynona Neatlalere please advise, thank you.

## 2018-07-02 ENCOUNTER — Encounter (HOSPITAL_COMMUNITY): Payer: Self-pay | Admitting: Pulmonary Disease

## 2018-07-02 LAB — ACID FAST SMEAR (AFB): ACID FAST SMEAR - AFSCU2: NEGATIVE

## 2018-07-02 LAB — ACID FAST SMEAR (AFB, MYCOBACTERIA)

## 2018-07-03 ENCOUNTER — Telehealth: Payer: Self-pay | Admitting: Pulmonary Disease

## 2018-07-03 LAB — CULTURE, RESPIRATORY
CULTURE: NORMAL
GRAM STAIN: NONE SEEN

## 2018-07-03 LAB — CULTURE, RESPIRATORY W GRAM STAIN: Special Requests: NORMAL

## 2018-07-03 NOTE — Telephone Encounter (Signed)
Pt calling and requesting results from Wednesday's bronch.    AO please advise on results.  Thanks!

## 2018-07-06 ENCOUNTER — Other Ambulatory Visit: Payer: Self-pay | Admitting: Pulmonary Disease

## 2018-07-06 ENCOUNTER — Telehealth: Payer: Self-pay | Admitting: Pulmonary Disease

## 2018-07-06 MED ORDER — MOMETASONE FUROATE 220 MCG/INH IN AEPB: 1.0000 | INHALATION_SPRAY | Freq: Every day | RESPIRATORY_TRACT | 0 refills | Status: DC

## 2018-07-06 NOTE — Telephone Encounter (Signed)
Pt is calling back about the results that she saw on my chart [phone]

## 2018-07-06 NOTE — Telephone Encounter (Signed)
Pt is aware of results. Pt states she needs Rx for Asmanex sent to pharmacy as she only had a sample. Nothing more needed at this time.

## 2018-07-06 NOTE — Telephone Encounter (Signed)
Called patient unable to reach left message to give us a call back.

## 2018-07-06 NOTE — Telephone Encounter (Signed)
Patient returned phone call. °

## 2018-07-06 NOTE — Telephone Encounter (Signed)
Pt is calling back [phone]

## 2018-07-06 NOTE — Telephone Encounter (Signed)
Notes recorded by Tomma Lightninglalere, Adewale A, MD on 07/06/2018 at 9:20 AM EDT Patient already on Asmanex- on med list  Will continue this at present ------  Notes recorded by Tomma Lightninglalere, Adewale A, MD on 07/06/2018 at 9:14 AM EDT Please call patient  No infection  Increased lymphocytes in lung washings which is non specific, does not by itself diagnose any condition especially with her normal CT scan of the chest. It may mean just inflammation in the breathing tubes.  We will try inhaled steroids for a few weeks empirically- should help the cough   Left message for patient to call back for results.

## 2018-07-07 NOTE — Progress Notes (Signed)
Patient advise of results pre phone not on 07/03/18.

## 2018-07-07 NOTE — Telephone Encounter (Signed)
Called and spoke with pt but while I spoke with pt, she said someone else from the office had also spoken to her as well and they told her they were going to reach out to Dr. Wynona Neatlalere to see if there was a different inhaler other than the mometasone inhaler that he might want to switch her to due to her pharmacy being out.  Will send this to Dr. Wynona Neatlalere for him to be able to look at.  Dr. Wynona Neatlalere, please advise if there is a different inhaler pt can be switched to due to her pharmacy not having the mometasone inhaler. Thanks!

## 2018-07-08 ENCOUNTER — Encounter: Payer: Self-pay | Admitting: Primary Care

## 2018-07-08 ENCOUNTER — Ambulatory Visit: Payer: 59 | Admitting: Primary Care

## 2018-07-08 DIAGNOSIS — B9689 Other specified bacterial agents as the cause of diseases classified elsewhere: Secondary | ICD-10-CM | POA: Insufficient documentation

## 2018-07-08 DIAGNOSIS — J410 Simple chronic bronchitis: Secondary | ICD-10-CM | POA: Diagnosis not present

## 2018-07-08 DIAGNOSIS — J019 Acute sinusitis, unspecified: Secondary | ICD-10-CM | POA: Insufficient documentation

## 2018-07-08 MED ORDER — AMOXICILLIN-POT CLAVULANATE 875-125 MG PO TABS: 1.0000 | ORAL_TABLET | Freq: Two times a day (BID) | ORAL | 0 refills | Status: DC

## 2018-07-08 MED ORDER — BUDESONIDE-FORMOTEROL FUMARATE 160-4.5 MCG/ACT IN AERO: 2.0000 | INHALATION_SPRAY | Freq: Two times a day (BID) | RESPIRATORY_TRACT | 12 refills | Status: DC

## 2018-07-08 NOTE — Telephone Encounter (Signed)
Pt is calling back [phone] 

## 2018-07-08 NOTE — Telephone Encounter (Signed)
Pt is returning call. Pt states that the cough has gotten worse. Complaints of a head ache and chest pain from coughing.  Cb is [phone]

## 2018-07-08 NOTE — Telephone Encounter (Signed)
Called patient, patient reports increased SOB, cough, and chest discomfort. Patient given appt today at 12 noon. Nothing further needed at this time.

## 2018-07-08 NOTE — Telephone Encounter (Signed)
Called and left message informing patient that we needed to have her come in for a appt. Awaiting call back.

## 2018-07-08 NOTE — Assessment & Plan Note (Addendum)
Change inhaler to Symbicort per Dr. Wynona Neatlalere recommendations PRN albuterol
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
## 2018-07-08 NOTE — Patient Instructions (Signed)
Tx sinus infection with Augmentin x7 days Changing inhaler to Symbicort 160, take 2 puffs twice a day Use rescue inhaler 2 puffs every 4-6 hours as needed for wheezing/sob or coughing fits  Continues protonix twice a day Continue flonase daily Continue Singulair daily

## 2018-07-08 NOTE — Telephone Encounter (Signed)
Dr. Olalere, please advise. Thanks 

## 2018-07-08 NOTE — Assessment & Plan Note (Signed)
Symptoms consistent with acute sinusitis Tx augmentin

## 2018-07-09 ENCOUNTER — Ambulatory Visit: Payer: 59 | Admitting: Allergy & Immunology

## 2018-07-15 ENCOUNTER — Ambulatory Visit: Payer: 59 | Admitting: Pulmonary Disease

## 2018-07-15 ENCOUNTER — Encounter: Payer: Self-pay | Admitting: Pulmonary Disease

## 2018-07-15 VITALS — BP 118/64 | HR 108 | Ht 62.0 in | Wt 161.2 lb

## 2018-07-15 DIAGNOSIS — R059 Cough, unspecified: Secondary | ICD-10-CM

## 2018-07-15 DIAGNOSIS — R0683 Snoring: Secondary | ICD-10-CM

## 2018-07-15 DIAGNOSIS — R05 Cough: Secondary | ICD-10-CM | POA: Diagnosis not present

## 2018-07-15 DIAGNOSIS — R0602 Shortness of breath: Secondary | ICD-10-CM | POA: Diagnosis not present

## 2018-07-15 MED ORDER — FLUCONAZOLE 100 MG PO TABS: ORAL_TABLET | ORAL | 0 refills | Status: DC
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
## 2018-07-15 NOTE — Patient Instructions (Signed)
Persistent cough-better Shortness of breath-better  GERD-on Dexilant -Encourage behavioral modifications  Continue Symbicort  We will repeat PFT with methacholine challenge study prior to next visit  History of snoring, witnessed apneas We will obtain a home sleep study  Yeast infection-we will treat with a course of Diflucan  I will see you back following the evaluation for possible sleep disordered breathing Approximately about 3 months  Call with significant concerns

## 2018-07-23 ENCOUNTER — Ambulatory Visit (HOSPITAL_COMMUNITY)
Admission: RE | Admit: 2018-07-23 | Discharge: 2018-07-23 | Disposition: A | Discharge status: Home / Self Care | Payer: 59 | Source: Ambulatory Visit | Attending: Pulmonary Disease | Admitting: Pulmonary Disease

## 2018-07-23 DIAGNOSIS — R05 Cough: Secondary | ICD-10-CM | POA: Insufficient documentation

## 2018-07-23 DIAGNOSIS — R0683 Snoring: Secondary | ICD-10-CM | POA: Diagnosis not present

## 2018-07-23 DIAGNOSIS — R059 Cough, unspecified: Secondary | ICD-10-CM

## 2018-07-23 LAB — PULMONARY FUNCTION TEST
FEF 25-75 Post: 1.91 L/sec
FEF 25-75 Pre: 2.68 L/sec
FEF2575-%CHANGE-POST: -29 %
FEF2575-%Pred-Post: 78 %
FEF2575-%Pred-Pre: 110 %
FEV1-%CHANGE-POST: -4 %
FEV1-%Pred-Post: 101 %
FEV1-%Pred-Pre: 106 %
FEV1-Post: 2.25 L
FEV1-Pre: 2.35 L
FEV1FVC-%CHANGE-POST: -3 %
FEV1FVC-%Pred-Pre: 99 %
FEV6-%Change-Post: -1 %
FEV6-%PRED-PRE: 107 %
FEV6-%Pred-Post: 105 %
FEV6-PRE: 2.86 L
FEV6-Post: 2.82 L
FEV6FVC-%Change-Post: 0 %
FEV6FVC-%PRED-PRE: 103 %
FEV6FVC-%Pred-Post: 102 %
FVC-%CHANGE-POST: -1 %
FVC-%PRED-PRE: 104 %
FVC-%Pred-Post: 103 %
FVC-POST: 2.83 L
FVC-PRE: 2.86 L
POST FEV6/FVC RATIO: 100 %
PRE FEV6/FVC RATIO: 100 %
Post FEV1/FVC ratio: 79 %
Pre FEV1/FVC ratio: 82 %

## 2018-07-23 MED ORDER — METHACHOLINE 0.0625 MG/ML NEB SOLN
2.0000 mL | Freq: Once | RESPIRATORY_TRACT | Status: AC
  Administered 2018-07-23: 0.125 mg via RESPIRATORY_TRACT
  Filled 2018-07-23: qty 2

## 2018-07-23 MED ORDER — METHACHOLINE 4 MG/ML NEB SOLN
2.0000 mL | Freq: Once | RESPIRATORY_TRACT | Status: AC
  Administered 2018-07-23: 8 mg via RESPIRATORY_TRACT
  Filled 2018-07-23: qty 2

## 2018-07-23 MED ORDER — METHACHOLINE 0.25 MG/ML NEB SOLN
2.0000 mL | Freq: Once | RESPIRATORY_TRACT | Status: AC
  Administered 2018-07-23: 0.5 mg via RESPIRATORY_TRACT
  Filled 2018-07-23: qty 2

## 2018-07-23 MED ORDER — METHACHOLINE 16 MG/ML NEB SOLN
2.0000 mL | Freq: Once | RESPIRATORY_TRACT | Status: AC
  Administered 2018-07-23: 32 mg via RESPIRATORY_TRACT
  Filled 2018-07-23: qty 2

## 2018-07-23 MED ORDER — ALBUTEROL SULFATE (2.5 MG/3ML) 0.083% IN NEBU
2.5000 mg | INHALATION_SOLUTION | Freq: Once | RESPIRATORY_TRACT | Status: AC
  Administered 2018-07-23: 2.5 mg via RESPIRATORY_TRACT

## 2018-07-23 MED ORDER — SODIUM CHLORIDE 0.9 % IN NEBU
3.0000 mL | INHALATION_SOLUTION | Freq: Once | RESPIRATORY_TRACT | Status: AC
  Administered 2018-07-23: 3 mL via RESPIRATORY_TRACT
  Filled 2018-07-23: qty 3

## 2018-07-23 MED ORDER — METHACHOLINE 1 MG/ML NEB SOLN
2.0000 mL | Freq: Once | RESPIRATORY_TRACT | Status: AC
  Administered 2018-07-23: 2 mg via RESPIRATORY_TRACT
  Filled 2018-07-23: qty 2

## 2018-07-24 ENCOUNTER — Telehealth: Payer: Self-pay | Admitting: Pulmonary Disease

## 2018-07-24 NOTE — Telephone Encounter (Signed)
Called and spoke to patient. Patient stated that she had the pulmonary function test with methacholine challenge and she is wanted to know if her results are in. She stated that it felt really difficult and she would like to know what her next steps should be.    Dr. Wynona Neatlalere please advise, thank you!

## 2018-07-27 MED ORDER — ALBUTEROL SULFATE HFA 108 (90 BASE) MCG/ACT IN AERS: 2.0000 | INHALATION_SPRAY | Freq: Four times a day (QID) | RESPIRATORY_TRACT | 5 refills | Status: DC | PRN

## 2018-07-27 NOTE — Telephone Encounter (Signed)
LMTCB

## 2018-07-27 NOTE — Telephone Encounter (Signed)
  PFT/methacholine was positive for airway hyperresponsiveness Not consistent with asthma--not hyperactive enough to say she has asthma  Did show significant hyperresponsiveness that I think we should continue with Symbicort  No other treatment options apart from trying to calm down the breathing tubes

## 2018-07-27 NOTE — Telephone Encounter (Signed)
Spoke with pt. She is aware of results. While speaking to her, she requested a refill on Albuterol HFA. This has been taken care of. Nothing further was needed.

## 2018-07-27 NOTE — Telephone Encounter (Signed)
Patient returning Brittany's call, CB is [phone].

## 2018-07-30 ENCOUNTER — Telehealth: Payer: Self-pay | Admitting: Pulmonary Disease

## 2018-07-30 DIAGNOSIS — R0683 Snoring: Secondary | ICD-10-CM

## 2018-07-30 LAB — FUNGUS CULTURE WITH STAIN

## 2018-07-30 LAB — FUNGUS CULTURE RESULT

## 2018-07-30 LAB — FUNGAL ORGANISM REFLEX

## 2018-07-30 NOTE — Telephone Encounter (Signed)
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 She has been doing this for about a month and they have found asbestos in the ceiling. She is wanting to know if this can contribute to her problems.   She also wanted to know about her methocholine challenge she had done. She was told the test was normal then was told that it was positive for airway destructive disease. AO please advise, thank you.

## 2018-07-31 ENCOUNTER — Telehealth: Payer: Self-pay | Admitting: Pulmonary Disease

## 2018-07-31 ENCOUNTER — Other Ambulatory Visit: Payer: Self-pay | Admitting: *Deleted

## 2018-07-31 DIAGNOSIS — R0683 Snoring: Secondary | ICD-10-CM

## 2018-08-03 NOTE — Telephone Encounter (Signed)
Called and spoke with patient advised to continue the Symbicort.She verbalized understanding  Nothing further needed at this time.

## 2018-08-03 NOTE — Telephone Encounter (Signed)
please call patient  Asbestos exposure is unlikely to cause current symptoms. Massive exposure can give you acute symptoms-when you are directly working with asbestos  The treatment for what was found on the methacholine challenge is to continue with inhaled steroids The only means of keeping the airway less hyperactive  No other indicated treatment at present

## 2018-08-06 ENCOUNTER — Telehealth: Payer: Self-pay | Admitting: Pulmonary Disease

## 2018-08-06 MED ORDER — PREDNISONE 10 MG PO TABS: 10.0000 mg | ORAL_TABLET | Freq: Two times a day (BID) | ORAL | 0 refills | Status: DC

## 2018-08-06 MED ORDER — LEVOFLOXACIN 750 MG PO TABS: 750.0000 mg | ORAL_TABLET | Freq: Every day | ORAL | 0 refills | Status: DC

## 2018-08-06 NOTE — Addendum Note (Signed)
Addended by: Sylvester Harder on: 08/06/2018 04:36 PM   Modules accepted: Orders

## 2018-08-06 NOTE — Telephone Encounter (Signed)
I called and spoke with Dr. Wynona Neat and have gotten verbal orders for Predisone 10mg  Bid for 5 day and Levaquin 750 qd for 5 days.   I have called the patient she is aware of these rec and nothing further needed atr this time

## 2018-08-06 NOTE — Telephone Encounter (Signed)
Called and spoke with Patient.  She stated that she is having a productive cough with thick, clear phlegm.  Patient stated that she could not come in and see a NP, because she is flying to [place] at [place].  She is requesting any prescriptions to be sent to [facility] in [place].  Will route to Dr. Wynona Neat  No Known Allergies   Current Outpatient Medications on File Prior to Visit  Medication Sig Dispense Refill  . albuterol (PROVENTIL HFA;VENTOLIN HFA) 108 (90 Base) MCG/ACT inhaler Inhale 2 puffs into the lungs every 6 (six) hours as needed for wheezing or shortness of breath. 1 Inhaler 5  . amitriptyline (ELAVIL) 25 MG tablet Take 25 mg by mouth at bedtime as needed for sleep.     Marland Kitchen amoxicillin-clavulanate (AUGMENTIN) 875-125 MG tablet Take 1 tablet by mouth 2 (two) times daily. 14 tablet 0  . Azelastine-Fluticasone (DYMISTA) 137-50 MCG/ACT SUSP Use 2 sprays per nostril 1-2 times daily 23 g 5  . benzonatate (TESSALON) 100 MG capsule Take 1 capsule (100 mg total) by mouth every 6 (six) hours as needed for cough. 30 capsule 1  . budesonide-formoterol (SYMBICORT) 160-4.5 MCG/ACT inhaler Inhale 2 puffs into the lungs 2 (two) times daily. 1 Inhaler 12  . Carbinoxamine Maleate (RYVENT) 6 MG TABS Take 6 mg by mouth every 8 (eight) hours as needed. 30 tablet 5  . EPINEPHrine (AUVI-Q) 0.3 mg/0.3 mL IJ SOAJ injection Use as directed for severe allergic reaction 2 Device 1  . ESTARYLLA 0.25-35 MG-MCG tablet Take 1 tablet by mouth daily.  0  . fluconazole (DIFLUCAN) 100 MG tablet Two tabs for the first day, then 1 tab daily for 14 days then stop. 16 tablet 0  . fluticasone (FLONASE) 50 MCG/ACT nasal spray Place 2 sprays into both nostrils daily as needed for allergies or rhinitis.    . mometasone (ASMANEX, 60 METERED DOSES,) 220 MCG/INH inhaler Inhale 1 puff into the lungs daily. 1 Inhaler 0  . montelukast (SINGULAIR) 10 MG tablet Take 1 tablet (10 mg total) by mouth at bedtime. 30 tablet 5  .  naphazoline-pheniramine (NAPHCON-A) 0.025-0.3 % ophthalmic solution Place 2 drops into both eyes daily as needed for eye irritation.    . pantoprazole (PROTONIX) 40 MG tablet Take 40 mg by mouth 2 (two) times daily.   11  . ranitidine (ZANTAC) 150 MG tablet Take 150 mg by mouth 2 (two) times daily.   0  . Respiratory Therapy Supplies (FLUTTER) DEVI Use as directed 1 each 0  . Spacer/Aero-Holding Chambers (AEROCHAMBER MV) inhaler Use as instructed 1 each 0  . SUMAtriptan (IMITREX) 25 MG tablet Take 25 mg by mouth every 2 (two) hours as needed for migraine.   1   No current facility-administered medications on file prior to visit.

## 2018-08-11 NOTE — Telephone Encounter (Signed)
Created in error. Will close this encounter.

## 2018-08-13 ENCOUNTER — Telehealth: Payer: Self-pay | Admitting: Pulmonary Disease

## 2018-08-13 NOTE — Telephone Encounter (Signed)
Dr. Wynona Neat has reviewed the home sleep test this test was negative for sleep apnea.  Dr . Wynona Neat recommendation are clincal follow up. Weight loss may help with snoring.Also regular exercise may help with sleep quality.   Patient is aware and nothing further is needed at this time.

## 2018-08-14 LAB — ACID FAST CULTURE WITH REFLEXED SENSITIVITIES (MYCOBACTERIA): Acid Fast Culture: NEGATIVE

## 2018-08-14 LAB — ACID FAST CULTURE WITH REFLEXED SENSITIVITIES

## 2018-08-27 ENCOUNTER — Ambulatory Visit: Payer: 59 | Admitting: Allergy & Immunology

## 2018-08-27 ENCOUNTER — Encounter: Payer: Self-pay | Admitting: Allergy & Immunology

## 2018-08-27 VITALS — BP 130/80 | HR 84 | Resp 16

## 2018-08-27 DIAGNOSIS — K219 Gastro-esophageal reflux disease without esophagitis: Secondary | ICD-10-CM

## 2018-08-27 DIAGNOSIS — R0602 Shortness of breath: Secondary | ICD-10-CM | POA: Diagnosis not present

## 2018-08-27 DIAGNOSIS — T781XXD Other adverse food reactions, not elsewhere classified, subsequent encounter: Secondary | ICD-10-CM | POA: Diagnosis not present

## 2018-08-27 DIAGNOSIS — J3089 Other allergic rhinitis: Secondary | ICD-10-CM

## 2018-08-27 DIAGNOSIS — J302 Other seasonal allergic rhinitis: Secondary | ICD-10-CM

## 2018-08-27 MED ORDER — OLOPATADINE HCL 0.2 % OP SOLN: 1.0000 [drp] | Freq: Every day | OPHTHALMIC | 1 refills | Status: DC

## 2018-08-27 MED ORDER — CARBINOXAMINE MALEATE ER 4 MG/5ML PO SUER: 7.5000 mL | Freq: Two times a day (BID) | ORAL | 1 refills | Status: DC

## 2018-08-27 MED ORDER — IPRATROPIUM BROMIDE 0.06 % NA SOLN: 2.0000 | Freq: Three times a day (TID) | NASAL | 1 refills | Status: DC

## 2018-08-27 NOTE — Patient Instructions (Addendum)
1. Seasonal and perennial allergic rhinitis (trees, weeds, grasses, indoor molds, dust mites, cat, dog and cockroach) - Start taking: Xhance (fluticasone) 1-2 sprays per nostril twice daily + Karbinal 7.5 mL twice daily + nasal ipratropium two sprays per nostril every 8 hours as needed  - Start olopatadine 0.2% eye drops once daily.  - We will send in the script to the [facility] outpatient pharmacy, and they will call you to confirm your shipping address. - You can review how to use the device here: https://www.xhance.com - You can use an extra dose of the antihistamine, if needed, for breakthrough symptoms.  - Consider nasal saline rinses 1-2 times daily to remove allergens from the nasal cavities as well as help with mucous clearance (this is especially helpful to do before the nasal sprays are given) - Consider allergy shots as a means of long-term control. - Allergy shots "re-train" and "reset" the immune system to ignore environmental allergens and decrease the resulting immune response to those allergens (sneezing, itchy watery eyes, runny nose, nasal congestion, etc).    - Allergy shots improve symptoms in 75-85% of patients.  - Call your insurance company to check on any co-payments ane call us back when you make a decision.  2. Return in about 2 weeks (around 09/10/2018) at 8:30am.   Please inform us of any Emergency Department visits, hospitalizations, or changes in symptoms. Call us before going to the ED for breathing or allergy symptoms since we might be able to fit you in for a sick visit. Feel free to contact us anytime with any questions, problems, or concerns.  It was a pleasure to see you again today!  Websites that have reliable patient information: 1. American Academy of Asthma, Allergy, and Immunology: www.aaaai.org 2. Food Allergy Research and Education (FARE): foodallergy.org 3. Mothers of Asthmatics: http://www.asthmacommunitynetwork.org 4. American College of Allergy,  Asthma, and Immunology: MissingWeapons.ca   Make sure you are registered to vote! If you have moved or changed any of your contact information, you will need to get this updated before voting!      Allergy Shots   Allergies are the result of a chain reaction that starts in the immune system. Your immune system controls how your body defends itself. For instance, if you have an allergy to pollen, your immune system identifies pollen as an invader or allergen. Your immune system overreacts by producing antibodies called Immunoglobulin E (IgE). These antibodies travel to cells that release chemicals, causing an allergic reaction.  The concept behind allergy immunotherapy, whether it is received in the form of shots or tablets, is that the immune system can be desensitized to specific allergens that trigger allergy symptoms. Although it requires time and patience, the payback can be long-term relief.  How Do Allergy Shots Work?  Allergy shots work much like a vaccine. Your body responds to injected amounts of a particular allergen given in increasing doses, eventually developing a resistance and tolerance to it. Allergy shots can lead to decreased, minimal or no allergy symptoms.  There generally are two phases: build-up and maintenance. Build-up often ranges from three to six months and involves receiving injections with increasing amounts of the allergens. The shots are typically given once or twice a week, though more rapid build-up schedules are sometimes used.  The maintenance phase begins when the most effective dose is reached. This dose is different for each person, depending on how allergic you are and your response to the build-up injections. Once the maintenance dose is  reached, there are longer periods between injections, typically two to four weeks.  Occasionally doctors give cortisone-type shots that can temporarily reduce allergy symptoms. These types of shots are different and should  not be confused with allergy immunotherapy shots.  Who Can Be Treated with Allergy Shots?  Allergy shots may be a good treatment approach for people with allergic rhinitis (hay fever), allergic asthma, conjunctivitis (eye allergy) or stinging insect allergy.   Before deciding to begin allergy shots, you should consider:  . The length of allergy season and the severity of your symptoms . Whether medications and/or changes to your environment can control your symptoms . Your desire to avoid long-term medication use . Time: allergy immunotherapy requires a major time commitment . Cost: may vary depending on your insurance coverage  Allergy shots for children age 20 and older are effective and often well tolerated. They might prevent the onset of new allergen sensitivities or the progression to asthma.  Allergy shots are not started on patients who are pregnant but can be continued on patients who become pregnant while receiving them. In some patients with other medical conditions or who take certain common medications, allergy shots may be of risk. It is important to mention other medications you talk to your allergist.   When Will I Feel Better?  Some may experience decreased allergy symptoms during the build-up phase. For others, it may take as long as 12 months on the maintenance dose. If there is no improvement after a year of maintenance, your allergist will discuss other treatment options with you.  If you aren't responding to allergy shots, it may be because there is not enough dose of the allergen in your vaccine or there are missing allergens that were not identified during your allergy testing. Other reasons could be that there are high levels of the allergen in your environment or major exposure to non-allergic triggers like tobacco smoke.  What Is the Length of Treatment?  Once the maintenance dose is reached, allergy shots are generally continued for three to five years. The  decision to stop should be discussed with your allergist at that time. Some people may experience a permanent reduction of allergy symptoms. Others may relapse and a longer course of allergy shots can be considered.  What Are the Possible Reactions?  The two types of adverse reactions that can occur with allergy shots are local and systemic. Common local reactions include very mild redness and swelling at the injection site, which can happen immediately or several hours after. A systemic reaction, which is less common, affects the entire body or a particular body system. They are usually mild and typically respond quickly to medications. Signs include increased allergy symptoms such as sneezing, a stuffy nose or hives.  Rarely, a serious systemic reaction called anaphylaxis can develop. Symptoms include swelling in the throat, wheezing, a feeling of tightness in the chest, nausea or dizziness. Most serious systemic reactions develop within 30 minutes of allergy shots. This is why it is strongly recommended you wait in your doctor's office for 30 minutes after your injections. Your allergist is trained to watch for reactions, and his or her staff is trained and equipped with the proper medications to identify and treat them.  Who Should Administer Allergy Shots?  The preferred location for receiving shots is your prescribing allergist's office. Injections can sometimes be given at another facility where the physician and staff are trained to recognize and treat reactions, and have received instructions by your  prescribing allergist.

## 2018-08-27 NOTE — Progress Notes (Signed)
FOLLOW UP  Date of Service/Encounter:  08/27/18   Assessment:   Seasonal and perennial allergic rhinitis (trees, weeds, grasses, indoor molds, dust mites, cat, dog and cockroach)  Adverse food reaction (seafood) - with negative skin testing   Shortness of breath - with negative methacholine challenge (bronchoconstriction noted at the 16 mg dose) and no improvement with two months of ICS/LABA thereapy    Ms. Emma Larsen presents for a follow up appointment for management of her P/SAR with postnasal drip and coughing. Since I last saw her, she has been through multiple tests with Dr. Wynona Neat, per Pulmonologist, with largely unrevealing results. She has been placed on Symbicort without improvement even after two months of treatment.  She has been treated extensively for reflux and an endoscopy was largely unrevealing as well.  I think her cough is likely related to uncontrolled postnasal drip.  She does endorse a feeling of a ball of phlegm in her throat.  Her physical exam is notable for clear rhinorrhea and pale turbinates as well as cobblestoning in the posterior oropharynx, which is all consistent with allergic rhinitis.  However, it is odd that she has not responded to any antihistamines or other allergy medications.  We will treat with a new nasal spray as well as Karbinal and nasal ipratropium.  I am hoping that this combination will help dry out her rhinorrhea and improve her cough.  We also discussed allergy shots as a means of long-term control and she will call her insurance company to check on this.  Plan/Recommendations:   1. Seasonal and perennial allergic rhinitis (trees, weeds, grasses, indoor molds, dust mites, cat, dog and cockroach) - Start taking: Xhance (fluticasone) 1-2 sprays per nostril twice daily + Karbinal 7.5 mL twice daily + nasal ipratropium two sprays per nostril every 8 hours as needed  - Start olopatadine 0.2% eye drops once daily.  - We will send in the script  to the [facility] outpatient pharmacy, and they will call you to confirm your shipping address. - You can review how to use the device here: https://www.xhance.com - You can use an extra dose of the antihistamine, if needed, for breakthrough symptoms.  - Consider nasal saline rinses 1-2 times daily to remove allergens from the nasal cavities as well as help with mucous clearance (this is especially helpful to do before the nasal sprays are given) - Consider allergy shots as a means of long-term control. - Allergy shots "re-train" and "reset" the immune system to ignore environmental allergens and decrease the resulting immune response to those allergens (sneezing, itchy watery eyes, runny nose, nasal congestion, etc).    - Allergy shots improve symptoms in 75-85% of patients.  - Call your insurance company to check on any co-payments ane call us back when you make a decision.  2. Return in about 2 weeks (around 09/10/2018) at 8:30am.  Subjective:   Emma Larsen is a 47 y.o. female presenting today for follow up of  Chief Complaint  Patient presents with  . Allergic Rhinitis     mucous build up    Matthew Folks has a history of the following: Patient Active Problem List   Diagnosis Date Noted  . Sinusitis, acute 07/08/2018  . Simple chronic bronchitis (HCC) 06/03/2018  . Seasonal and perennial allergic rhinitis 02/19/2018  . SOB (shortness of breath) 02/16/2018  . Adverse food reaction 02/16/2018  . Chronic rhinitis 02/16/2018  . Gastroesophageal reflux disease 02/16/2018    History obtained from: chart review  and patient.  Abby Potash [facility] Primary Care Provider is Maryagnes Amos, FNP.     Emma Larsen is a 47 y.o. female presenting for a follow up visit.  She was last seen in April 2019.  At that time, she had testing that was positive to trees, weeds, grasses, indoor molds, dust mite, cat, dog, and cockroach.  We stopped her Clarinex and started RyVent as well as Dymista.   We did discuss allergy shots as a means of long-term control.  In the interim, she did go to see [facility] (Dr. Wynona Neat).  She did have a bronchoscopy that demonstrated lymphocytes as well as inflammation.  She was started on Symbicort, which according to his September 4 note did improve her symptoms.  However, she tells me that the Symbicort has not changed her symptoms at all today.  She also had a work-up for obstructive sleep apnea which was negative.  Since the last visit, she has continued to have problems despite 2 months of Symbicort treatment.  She has been on reflux medications without any improvement.  Asthma/Respiratory Symptom History: She is on Symbicort without improvement. She does have a spacer.  She has noted no improvement in her shortness of breath with 2 months of continuous Symbicort use.  She has required prednisone on a few occasions, which has not really helped her cough.  She has had a normal chest x-ray as well as a normal chest CT as recently as August 2019.  Allergic Rhinitis Symptom History: She has tried a multitude of antihistamines including Clarinex, Xyzal, Allegra, and cetirizine.  At the last visit, we put her on RyVent as well as Dymista.  These have not seem to help at all.  She has tried a homeopathic medication without improvement.  She reports that she continues to have a mucous ball in her throat as well as marked postnasal drip.  She is not excited about shots, but is willing to do anything to help with her postnasal drip.  Otherwise, there have been no changes to her past medical history, surgical history, family history, or social history.    Review of Systems: a 14-point review of systems is pertinent for what is mentioned in HPI.  Otherwise, all other systems were negative.  Constitutional: negative other than that listed in the HPI Eyes: negative other than that listed in the HPI Ears, nose, mouth, throat, and face: negative other than that  listed in the HPI Respiratory: negative other than that listed in the HPI Cardiovascular: negative other than that listed in the HPI Gastrointestinal: negative other than that listed in the HPI Genitourinary: negative other than that listed in the HPI Integument: negative other than that listed in the HPI Hematologic: negative other than that listed in the HPI Musculoskeletal: negative other than that listed in the HPI Neurological: negative other than that listed in the HPI Allergy/Immunologic: negative other than that listed in the HPI    Objective:   Blood pressure 130/80, pulse 84, resp. rate 16. There is no height or weight on file to calculate BMI.   Physical Exam:  General: Alert, interactive, in no acute distress. Talkative.  Eyes: No conjunctival injection bilaterally, no discharge on the right, no discharge on the left, no Horner-Trantas dots present and allergic shiners present bilaterally. PERRL bilaterally. EOMI without pain. No photophobia.  Ears: Right TM pearly gray with normal light reflex, Left TM pearly gray with normal light reflex, Right TM intact without perforation and Left TM intact without  perforation.  Nose/Throat: External nose within normal limits and septum midline. Turbinates edematous and pale with clear discharge. Posterior oropharynx erythematous without cobblestoning in the posterior oropharynx. Tonsils 2+ without exudates.  Tongue without thrush. Lungs: Clear to auscultation without wheezing, rhonchi or rales. No increased work of breathing. CV: Normal S1/S2. No murmurs. Capillary refill <2 seconds.  Skin: Warm and dry, without lesions or rashes. Neuro:   Grossly intact. No focal deficits appreciated. Responsive to questions.  Diagnostic studies: none    Malachi Bonds, MD  [facility]

## 2018-09-04 ENCOUNTER — Telehealth: Payer: Self-pay

## 2018-09-04 NOTE — Telephone Encounter (Signed)
The patient's insurance will not cover olopatadine 0.2%. Would you like to try generic Patanol?

## 2018-09-04 NOTE — Telephone Encounter (Signed)
Patient's insurance will not cover Karbinal. Please advise on alternative. Thank you

## 2018-09-10 ENCOUNTER — Ambulatory Visit: Payer: 59 | Admitting: Allergy & Immunology

## 2018-09-10 ENCOUNTER — Encounter: Payer: Self-pay | Admitting: Allergy & Immunology

## 2018-09-10 VITALS — BP 110/82 | HR 87 | Temp 98.2°F | Resp 20 | Ht 62.3 in | Wt 162.0 lb

## 2018-09-10 DIAGNOSIS — J302 Other seasonal allergic rhinitis: Secondary | ICD-10-CM | POA: Diagnosis not present

## 2018-09-10 DIAGNOSIS — J683 Other acute and subacute respiratory conditions due to chemicals, gases, fumes and vapors: Secondary | ICD-10-CM | POA: Diagnosis not present

## 2018-09-10 DIAGNOSIS — J3089 Other allergic rhinitis: Secondary | ICD-10-CM

## 2018-09-10 MED ORDER — IPRATROPIUM BROMIDE 0.06 % NA SOLN: 2.0000 | Freq: Three times a day (TID) | NASAL | 2 refills | Status: DC

## 2018-09-10 MED ORDER — FLUTICASONE PROPIONATE 93 MCG/ACT NA EXHU: 1.0000 | INHALANT_SUSPENSION | Freq: Two times a day (BID) | NASAL | 5 refills | Status: DC

## 2018-09-10 MED ORDER — CARBINOXAMINE MALEATE ER 4 MG/5ML PO SUER: 7.5000 mL | Freq: Two times a day (BID) | ORAL | 1 refills | Status: DC

## 2018-09-10 NOTE — Progress Notes (Signed)
FOLLOW UP  Date of Service/Encounter:  09/10/18   Assessment:   Seasonal and perennial allergic rhinitis(trees, weeds, grasses, indoor molds, dust mites, cat, dog and cockroach) - initiating allergen immunotherapy today  Adverse food reaction(seafood) - with negative skin testing   Shortness of breath - with negative methacholine challenge (bronchoconstriction noted at the 16 mg dose) and no improvement with two months of ICS/LABA therapy  Plan/Recommendations:   1. Seasonal and perennial allergic rhinitis (trees, weeds, grasses, indoor molds, dust mites, cat, dog and cockroach) - Continue taking: Xhance (fluticasone) 1-2 sprays per nostril twice daily + Karbinal 7.5 mL twice daily + nasal ipratropium two sprays per nostril every 8 hours as needed  - Continue with olopatadine 0.2% eye drops once daily.  - We will work on getting the [place] and the [place] approved.  - Make an appointment in two weeks for the first injection. - Consider allergy shots as a means of long-term control. - Start the prednisone taper to see if this helps: Take 3 tabs (30mg ) twice daily for 3 days, then 2 tabs (20mg ) twice daily for 3 days, then 1 tab (10mg ) twice daily for 3 days, then STOP. - We will put in a referral to ENT.  2. Shortness of breath/coughing - possible reactive airway dysfunction syndrome - Spirometry is normal and I agree that this is not asthma. - Start inhaled Atrovent two puffs every 6 hours as needed for coughing. - I am not sure that this will help, but it is worth a try.  - Typically reactive airway dysfunction syndrome is treated with inhaled steroids, but clearly that did not help you since you were on Symbicort.  - Try to avoid triggering environments as much as you are able.   3. Return in about 3 months (around 12/11/2018).  Subjective:   ASTOU LADA is a 47 y.o. female presenting today for follow up of  Chief Complaint  Patient presents with  . Follow-up   medication check    ARNETA MAHMOOD has a history of the following: Patient Active Problem List   Diagnosis Date Noted  . Sinusitis, acute 07/08/2018  . Simple chronic bronchitis (HCC) 06/03/2018  . Seasonal and perennial allergic rhinitis 02/19/2018  . SOB (shortness of breath) 02/16/2018  . Adverse food reaction 02/16/2018  . Chronic rhinitis 02/16/2018  . Gastroesophageal reflux disease 02/16/2018    History obtained from: chart review and patient.  Abby Potash [facility] Primary Care Provider is Maryagnes Amos, FNP.     Tonna is a 47 y.o. female presenting for a follow up visit.  She was last seen for a sick visit earlier this month.  At that time, her allergies were not under good control.  We started her on Xhance 2 sprays per nostril twice daily as well as [place] 7.5 mL twice daily and nasal ipratropium.  We also started Pataday eyedrops.  She was continuing to endorse a feeling of phlegm in her throat that have been unresponsive to antihistamines.  Her physical exam was notable for uncontrolled allergic appearing rhinitis with clear rhinorrhea and pale turbinates and cobblestoning.  She felt no improvement with Symbicort or reflux medications.  A recent chest x-ray and chest CT were negative.  A methacholine challenge was negative for asthma.  Since the last visit, she has continued to have problems with coughing and postnasal drip.  She reports that she continues to feel that there is mucus and points to the bottom of her neck.  She has had a cough productive of clear sputum, which is been ongoing for almost 1 year at this point.  The addition of the [place] might of provided some help.  However, her insurance does not seem like it will cover it well.  She has been on all of the other antihistamines without improvement.  However, she notes that she has not been on Allegra in 10+ years.  She feels that the addition of the [place] Sours has helped somewhat.  She would like to continue  getting this.  She is unsure of insurance coverage on that but we will do our best to make sure that this is covered.  She has been using the nasal ipratropium as recommended.  She continues to want to address her cough today.  Review of her history shows that she has been on Symbicort and reflux medications without improvement.  Albuterol does not seem to help much at all.  She does have an Acapella device at home which is never provided much relief.  She has never tried Atrovent as a rescue medication, so we will give this a chance to work today.  She feels that the mucus in her throat comes from both her nose as well as up from her lungs.  She has never seen an otolaryngologist and is interested in a referral today.  She has tried Mucinex to help clear her mucus.  This does not seem to do much.  She just wants something to "dry up her nose and throat".  Otherwise, there have been no changes to her past medical history, surgical history, family history, or social history.    Review of Systems: a 14-point review of systems is pertinent for what is mentioned in HPI.  Otherwise, all other systems were negative.  Constitutional: negative other than that listed in the HPI Eyes: negative other than that listed in the HPI Ears, nose, mouth, throat, and face: negative other than that listed in the HPI Respiratory: negative other than that listed in the HPI Cardiovascular: negative other than that listed in the HPI Gastrointestinal: negative other than that listed in the HPI Genitourinary: negative other than that listed in the HPI Integument: negative other than that listed in the HPI Hematologic: negative other than that listed in the HPI Musculoskeletal: negative other than that listed in the HPI Neurological: negative other than that listed in the HPI Allergy/Immunologic: negative other than that listed in the HPI    Objective:   Blood pressure 110/82, pulse 87, temperature 98.2 F (36.8 C),  temperature source Oral, resp. rate 20, height 5' 2.3" (1.582 m), weight 162 lb (73.5 kg), SpO2 96 %. Body mass index is 29.35 kg/m.   Physical Exam:  General: Alert, interactive, in no acute distress. Smiling and very amusing. Eyes: No conjunctival injection bilaterally, no discharge on the right, no discharge on the left and no Horner-Trantas dots present. PERRL bilaterally. EOMI without pain. No photophobia.  Ears: Right TM pearly gray with normal light reflex, Left TM pearly gray with normal light reflex, Right TM intact without perforation and Left TM intact without perforation.  Nose/Throat: External nose within normal limits and septum midline. Turbinates edematous and pale with clear discharge. Posterior oropharynx erythematous with cobblestoning in the posterior oropharynx. Tonsils 2+ without exudates.  Tongue without thrush. Lungs: Clear to auscultation without wheezing, rhonchi or rales. No increased work of breathing. CV: Normal S1/S2. No murmurs. Capillary refill <2 seconds.  Skin: Warm and dry, without lesions or rashes. Neuro:  Grossly intact. No focal deficits appreciated. Responsive to questions.  Diagnostic studies:  Spirometry: results normal (FEV1: 2.01/91%, FVC: 2.38/88%, FEV1/FVC: 84%).    Spirometry consistent with normal pattern.    Allergy Studies: none      Malachi Bonds, MD  [facility]

## 2018-09-10 NOTE — Patient Instructions (Addendum)
1. Seasonal and perennial allergic rhinitis (trees, weeds, grasses, indoor molds, dust mites, cat, dog and cockroach) - Continue taking: Xhance (fluticasone) 1-2 sprays per nostril twice daily + Karbinal 7.5 mL twice daily + nasal ipratropium two sprays per nostril every 8 hours as needed  - Continue with olopatadine 0.2% eye drops once daily.  - We will work on getting the [place] and the [place] approved.  - Make an appointment in two weeks for the first injection. - Consider allergy shots as a means of long-term control. - Start the prednisone taper to see if this helps: Take 3 tabs (30mg ) twice daily for 3 days, then 2 tabs (20mg ) twice daily for 3 days, then 1 tab (10mg ) twice daily for 3 days, then STOP.  2. Shortness of breath/coughing - possible reactive airway dysfunction syndrome - Spirometry is normal and I agree that this is not asthma. - Start inhaled Atroven two puffs every 6 hours as needed for coughing. - I am not sure that this will help, but it is worth a try.  - Typically reactive airway dysfunction syndrome is treated with inhaled steroids, but clearly that did not help you since you were on Symbicort.  - Try to avoid triggering environments as much as you are able.   3. Return in about 3 months (around 12/11/2018).   Please inform us of any Emergency Department visits, hospitalizations, or changes in symptoms. Call us before going to the ED for breathing or allergy symptoms since we might be able to fit you in for a sick visit. Feel free to contact us anytime with any questions, problems, or concerns.  It was a pleasure to see you again today!  Websites that have reliable patient information: 1. American Academy of Asthma, Allergy, and Immunology: www.aaaai.org 2. Food Allergy Research and Education (FARE): foodallergy.org 3. Mothers of Asthmatics: http://www.asthmacommunitynetwork.org 4. American College of Allergy, Asthma, and Immunology: MissingWeapons.ca   Make sure  you are registered to vote! If you have moved or changed any of your contact information, you will need to get this updated before voting!

## 2018-09-10 NOTE — Addendum Note (Signed)
Addended by: Valarie Cones on: 09/10/2018 05:34 PM   Modules accepted: Orders

## 2018-09-11 ENCOUNTER — Telehealth: Payer: Self-pay

## 2018-09-11 NOTE — Telephone Encounter (Signed)
Patient states that she thinks the Joyce Copa is helping.  Stated that she has not had any coughing episodes.

## 2018-09-11 NOTE — Telephone Encounter (Signed)
I saw her earlier this week and we are changing to Allegra instead.  Malachi Bonds, MD [facility]

## 2018-09-11 NOTE — Telephone Encounter (Signed)
I would call her and see if the Joyce Copa is working fine. We started that a few days ago. If this is working, there is no need to jump through hoops.  Malachi Bonds, MD [facility]

## 2018-09-11 NOTE — Telephone Encounter (Signed)
Received notification from the pharmacy that Emma Larsen is not covered under patients plan.  Initiate PA or is there an alternative medication we can send?

## 2018-09-14 ENCOUNTER — Telehealth: Payer: Self-pay | Admitting: Allergy & Immunology

## 2018-09-14 NOTE — Telephone Encounter (Signed)
Pt called and said that she received a bill for no shows for  $50.00. She said that she was rush to her pulmonary so she was not here for 07/09/2018  and said that she did not have appointment on 05/21/2018

## 2018-09-14 NOTE — Telephone Encounter (Signed)
Adjusted second copay due to circumstances - kt

## 2018-09-15 ENCOUNTER — Telehealth: Payer: Self-pay

## 2018-09-15 DIAGNOSIS — J301 Allergic rhinitis due to pollen: Secondary | ICD-10-CM | POA: Diagnosis not present

## 2018-09-15 NOTE — Telephone Encounter (Signed)
Noted. Thanks!   Gwynn Crossley, MD [facility]  

## 2018-09-15 NOTE — Telephone Encounter (Signed)
Referral has been placed to Dr Teohs office.  

## 2018-09-15 NOTE — Telephone Encounter (Signed)
-----   Message from Alfonse Spruce, MD sent at 09/10/2018  9:54 AM EDT ----- ENT referral placed

## 2018-09-15 NOTE — Progress Notes (Signed)
VIALS EXP 09-17-19 

## 2018-09-16 DIAGNOSIS — J3089 Other allergic rhinitis: Secondary | ICD-10-CM | POA: Diagnosis not present

## 2018-09-24 ENCOUNTER — Ambulatory Visit (INDEPENDENT_AMBULATORY_CARE_PROVIDER_SITE_OTHER): Payer: 59 | Admitting: *Deleted

## 2018-09-24 DIAGNOSIS — J309 Allergic rhinitis, unspecified: Secondary | ICD-10-CM | POA: Diagnosis not present

## 2018-09-24 NOTE — Progress Notes (Signed)
Immunotherapy   Patient Details  Name: Emma FolksCarla D Williams MRN: [id number] Date of Birth: 18-Dec-1970  09/24/2018  Emma Folksarla D Williams started injections for  Grass-Weed-Tree-Cat-Dog & Mold-Cockroach-Ragweed. Following schedule: B  Frequency:2 times per week Epi-Pen:Epi-Pen Available  Consent signed and patient instructions given. No problems after 30 minutes in the office.   Mariane DuvalHeather L Kaylin Schellenberg 09/24/2018, 9:22 AM

## 2018-09-30 ENCOUNTER — Ambulatory Visit (INDEPENDENT_AMBULATORY_CARE_PROVIDER_SITE_OTHER): Payer: 59 | Admitting: *Deleted

## 2018-09-30 DIAGNOSIS — J309 Allergic rhinitis, unspecified: Secondary | ICD-10-CM | POA: Diagnosis not present

## 2018-10-02 ENCOUNTER — Telehealth: Payer: Self-pay | Admitting: Allergy & Immunology

## 2018-10-02 ENCOUNTER — Ambulatory Visit (INDEPENDENT_AMBULATORY_CARE_PROVIDER_SITE_OTHER): Payer: 59 | Admitting: *Deleted

## 2018-10-02 DIAGNOSIS — J309 Allergic rhinitis, unspecified: Secondary | ICD-10-CM

## 2018-10-02 MED ORDER — FEXOFENADINE HCL 180 MG PO TABS: 180.0000 mg | ORAL_TABLET | Freq: Every day | ORAL | 5 refills | Status: DC

## 2018-10-02 NOTE — Telephone Encounter (Signed)
[facility] [place]

## 2018-10-02 NOTE — Telephone Encounter (Signed)
Prescription sent in as requested 

## 2018-10-05 ENCOUNTER — Ambulatory Visit (INDEPENDENT_AMBULATORY_CARE_PROVIDER_SITE_OTHER): Payer: 59 | Admitting: *Deleted

## 2018-10-05 DIAGNOSIS — J309 Allergic rhinitis, unspecified: Secondary | ICD-10-CM | POA: Diagnosis not present

## 2018-10-13 ENCOUNTER — Telehealth: Payer: Self-pay | Admitting: *Deleted

## 2018-10-13 NOTE — Telephone Encounter (Signed)
Patient's insurance will not cover Karbinal. Please advise on alternative medication and thank you.

## 2018-10-13 NOTE — Telephone Encounter (Signed)
Prior [facility] [place] [facility] through [employer]

## 2018-10-14 ENCOUNTER — Other Ambulatory Visit: Payer: Self-pay | Admitting: Family

## 2018-10-14 ENCOUNTER — Ambulatory Visit (INDEPENDENT_AMBULATORY_CARE_PROVIDER_SITE_OTHER): Payer: 59 | Admitting: *Deleted

## 2018-10-14 DIAGNOSIS — J309 Allergic rhinitis, unspecified: Secondary | ICD-10-CM

## 2018-10-14 DIAGNOSIS — Z1231 Encounter for screening mammogram for malignant neoplasm of breast: Secondary | ICD-10-CM

## 2018-10-14 MED ORDER — CARBINOXAMINE MALEATE 4 MG PO TABS: 1.0000 | ORAL_TABLET | Freq: Two times a day (BID) | ORAL | 5 refills | Status: DC | PRN

## 2018-10-14 NOTE — Addendum Note (Signed)
Addended by: Mliss FritzBLACK, Lionel Woodberry I on: 10/14/2018 04:08 PM   Modules accepted: Orders

## 2018-10-14 NOTE — Telephone Encounter (Signed)
You cannot make magic happen, Kayla?  In that case, let us change to either RyVent or carbinoxamine 4 mg.  I am not sure if she has tried that yet.  Malachi BondsJoel Timothea Bodenheimer, MD [facility] [place]

## 2018-10-15 ENCOUNTER — Ambulatory Visit
Admission: RE | Admit: 2018-10-15 | Discharge: 2018-10-15 | Disposition: A | Discharge status: Home / Self Care | Payer: 59 | Source: Ambulatory Visit | Attending: Family | Admitting: Family

## 2018-10-15 DIAGNOSIS — Z1231 Encounter for screening mammogram for malignant neoplasm of breast: Secondary | ICD-10-CM

## 2018-10-21 ENCOUNTER — Ambulatory Visit (INDEPENDENT_AMBULATORY_CARE_PROVIDER_SITE_OTHER): Payer: 59 | Admitting: *Deleted

## 2018-10-21 DIAGNOSIS — J309 Allergic rhinitis, unspecified: Secondary | ICD-10-CM | POA: Diagnosis not present

## 2018-10-22 ENCOUNTER — Telehealth: Payer: Self-pay

## 2018-10-22 NOTE — Telephone Encounter (Signed)
Spoke with patient, she stated that she has been on a lot of antibiotics and that she believes she is having a UTI. She wanted to know if Dr. Dellis AnesGallagher could call her in something. Informed that our office does not call in those types of medications and advised her to call her PCP.  Patient voiced understanding.

## 2018-11-18 ENCOUNTER — Telehealth: Payer: Self-pay | Admitting: *Deleted

## 2018-11-18 NOTE — Telephone Encounter (Signed)
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 Patient will follow up with the ent on the 19th. Patient wanted to make Dr Dellis AnesGallagher aware.

## 2018-11-18 NOTE — Telephone Encounter (Signed)
Patient wants to know if she should continue immunotherapy please advise

## 2018-11-18 NOTE — Telephone Encounter (Signed)
I am glad that they are trying something new! I think it would be useful to continue allergy shots to help with other symptoms including congestion, itchy watery eyes, and postnasal drip. If she wants to stop, though, this is fine too.   Malachi Bonds, MD [facility]

## 2018-11-18 NOTE — Telephone Encounter (Signed)
Called patient and explained. Patient verbalized understanding. Patient stated that they will come in Monday to restart allergy injections. Since it will be over a month since she has had her last allergy injection what do you recommend would be the best dose for her next injection?

## 2018-11-20 NOTE — Telephone Encounter (Signed)
Reviewed notes. Let's start at [facility] 0.2 mL.    Malachi Bonds, MD [facility]

## 2018-11-20 NOTE — Telephone Encounter (Signed)
Noted. Information has been documented in flowsheet.

## 2018-12-15 ENCOUNTER — Ambulatory Visit: Payer: 59 | Admitting: Allergy & Immunology

## 2019-01-05 ENCOUNTER — Ambulatory Visit: Payer: 59 | Admitting: Allergy & Immunology

## 2019-01-05 ENCOUNTER — Encounter: Payer: Self-pay | Admitting: Allergy & Immunology

## 2019-01-05 VITALS — BP 130/88 | HR 87 | Resp 16 | Ht 62.0 in | Wt 167.0 lb

## 2019-01-05 DIAGNOSIS — R05 Cough: Secondary | ICD-10-CM | POA: Diagnosis not present

## 2019-01-05 DIAGNOSIS — J3089 Other allergic rhinitis: Secondary | ICD-10-CM | POA: Diagnosis not present

## 2019-01-05 DIAGNOSIS — R059 Cough, unspecified: Secondary | ICD-10-CM

## 2019-01-05 DIAGNOSIS — J302 Other seasonal allergic rhinitis: Secondary | ICD-10-CM

## 2019-01-05 NOTE — Progress Notes (Signed)
FOLLOW UP  Date of Service/Encounter:  01/05/19   Assessment:   Seasonal and perennial allergic rhinitis(trees, weeds, grasses, indoor molds, dust mites, cat, dog and cockroach) - initiating allergen immunotherapy today  Adverse food reaction(seafood) - with negative skin testing  Shortness of breath - with negative methacholine challenge(bronchoconstriction noted at the 16 mg dose)and no improvement with two months of ICS/LABA therapy - ? vocal cord dysfunction  Plan/Recommendations:   1. Seasonal and perennial allergic rhinitis (trees, weeds, grasses, indoor molds, dust mites, cat, dog and cockroach) - Continue taking: Xhance (fluticasone) 1-2 sprays per nostril twice daily and the olopatadine 0.2% eye drops once daily during the worst times of the year - Use the NeilMed bottle prior to giving the [place].  - Consider restarting allergy shots as a means of long-term control. - A cough should not preclude her from getting an allergy shot.   2. Shortness of breath/coughing - possible reactive airway dysfunction syndrome - Continue following up with Dr. Suszanne Conners.  - Send me updates ([email or web address]).  3. Return in about 6 months (around 07/06/2019).   Subjective:   Emma Larsen is a 48 y.o. female presenting today for follow up of  Chief Complaint  Patient presents with  . Allergic Rhinitis     Emma Larsen has a history of the following: Patient Active Problem List   Diagnosis Date Noted  . Sinusitis, acute 07/08/2018  . Simple chronic bronchitis (HCC) 06/03/2018  . Seasonal and perennial allergic rhinitis 02/19/2018  . SOB (shortness of breath) 02/16/2018  . Adverse food reaction 02/16/2018  . Chronic rhinitis 02/16/2018  . Gastroesophageal reflux disease 02/16/2018    History obtained from: chart review and patient.  [name] is a 48 y.o. female presenting for a follow up visit.  She was last seen in October 2019.  At that time, her allergic  rhinitis was not under good control.  She was endorsing a globus sensation of mucus in her throat.  We continued her on her nasal steroid spray and added Karbinal 7.5 mL twice daily in combination with nasal ipratropium.  I also continued Pataday eyedrops daily as needed.  We did decide to start allergen immunotherapy.  We started a prednisone taper as well.  We also put in a referral to ENT for continued cough.  Her spirometry was normal and I felt that this was not consistent with asthma.  We did add on inhaled Atrovent to see if this would help.  She had previously failed Symbicort as a means of controlling her reactive airway dysfunction syndrome, and I recommended avoiding triggering environments as possible.  Asthma/Respiratory Symptom History: She has continued to cough. She is gabapentin which has not helped at all. She said that ENT recommended 1 gabapentin in the morning and 2 in the evenings.  She was also placed on Ativan to help with her cough as well. This is all being managed by Dr. Suszanne Conners.   Allergic Rhinitis Symptom History: She continues to have problems with a globus sensation.  She remains on her nasal steroid.  She has stopped all of her antihistamines, since none of them seem to have helped.  She has not been on antibiotics since last visit.  She remains interested in restarting her allergy shots, but her cough continues to be an issue.  Therefore, she has not come to restart.   Otherwise, there is no history of other atopic diseases, including food allergies, drug allergies, stinging insect allergies, eczema, urticaria or contact dermatitis.  There is no significant infectious history. Vaccinations are up to date.   Otherwise, there have been no changes to her past medical history, surgical history, family history, or social history.    Review of Systems  Constitutional: Negative.  Negative for fever, malaise/fatigue and weight loss.  HENT: Positive for congestion. Negative for ear  discharge and ear pain.        Positive for postnasal drip.  Eyes: Negative for pain, discharge and redness.  Respiratory: Negative for cough, sputum production, shortness of breath and wheezing.   Cardiovascular: Negative.  Negative for chest pain and palpitations.  Gastrointestinal: Negative for abdominal pain and heartburn.  Skin: Negative.  Negative for itching and rash.  Neurological: Negative for dizziness and headaches.  Endo/Heme/Allergies: Negative for environmental allergies. Does not bruise/bleed easily.       Objective:   Blood pressure 130/88, pulse 87, resp. rate 16, height 5\' 2"  (1.575 m), weight 167 lb (75.8 kg), SpO2 97 %. Body mass index is 30.54 kg/m.   Physical Exam:  Physical Exam  Constitutional: She appears well-developed.  HENT:  Head: Normocephalic and atraumatic.  Right Ear: External ear normal.  Left Ear: External ear normal.  Nose: Mucosal edema and rhinorrhea present. No nasal deformity or septal deviation. No epistaxis. Right sinus exhibits no maxillary sinus tenderness and no frontal sinus tenderness. Left sinus exhibits no maxillary sinus tenderness and no frontal sinus tenderness.  Mouth/Throat: Uvula is midline and oropharynx is clear and moist. Mucous membranes are not pale and not dry.  Bilateral cerumen impaction.  Tympanic membranes not visualized.  Eyes: Pupils are equal, round, and reactive to light. Conjunctivae and EOM are normal. Right eye exhibits no chemosis and no discharge. Left eye exhibits no chemosis and no discharge. Right conjunctiva is not injected. Left conjunctiva is not injected.  Cardiovascular: Normal rate, regular rhythm and normal heart sounds.  Respiratory: Effort normal and breath sounds normal. No accessory muscle usage. No tachypnea. No respiratory distress. She has no wheezes. She has no rhonchi. She has no rales. She exhibits no tenderness.  GI: There is no abdominal tenderness. There is no rebound and no guarding.    Lymphadenopathy:       Head (right side): No submandibular, no tonsillar and no occipital adenopathy present.       Head (left side): No submandibular, no tonsillar and no occipital adenopathy present.    She has no cervical adenopathy.  Neurological: She is alert.  Skin: No abrasion, no petechiae and no rash noted. Rash is not papular, not vesicular and not urticarial. No erythema. No pallor.  Psychiatric: She has a normal mood and affect.     Diagnostic studies: none     Malachi Bonds, MD  [facility]

## 2019-01-05 NOTE — Patient Instructions (Addendum)
1. Seasonal and perennial allergic rhinitis (trees, weeds, grasses, indoor molds, dust mites, cat, dog and cockroach) - Continue taking: Xhance (fluticasone) 1-2 sprays per nostril twice daily and the olopatadine 0.2% eye drops once daily during the worst times of the year - Use the NeilMed bottle prior to giving the [place].  - Consider restarting allergy shots as a means of long-term control.  2. Shortness of breath/coughing - possible reactive airway dysfunction syndrome - Continue following up with Dr. Suszanne Conners.  - Send me updates ([email or web address]).  3. Return in about 6 months (around 07/06/2019).   Please inform us of any Emergency Department visits, hospitalizations, or changes in symptoms. Call us before going to the ED for breathing or allergy symptoms since we might be able to fit you in for a sick visit. Feel free to contact us anytime with any questions, problems, or concerns.  It was a pleasure to see you again today!  Websites that have reliable patient information: 1. American Academy of Asthma, Allergy, and Immunology: www.aaaai.org 2. Food Allergy Research and Education (FARE): foodallergy.org 3. Mothers of Asthmatics: http://www.asthmacommunitynetwork.org 4. American College of Allergy, Asthma, and Immunology: MissingWeapons.ca   Make sure you are registered to vote! If you have moved or changed any of your contact information, you will need to get this updated before voting!

## 2019-04-26 ENCOUNTER — Telehealth: Payer: Self-pay

## 2019-04-26 ENCOUNTER — Other Ambulatory Visit: Payer: Self-pay | Admitting: Allergy & Immunology

## 2019-04-26 MED ORDER — PANTOPRAZOLE SODIUM 40 MG PO TBEC: 40.0000 mg | DELAYED_RELEASE_TABLET | Freq: Every day | ORAL | 0 refills | Status: DC

## 2019-04-26 MED ORDER — FAMOTIDINE 20 MG PO TABS: 20.0000 mg | ORAL_TABLET | Freq: Two times a day (BID) | ORAL | 0 refills | Status: DC

## 2019-04-26 NOTE — Addendum Note (Signed)
Addended by: Orlene Erm on: 04/26/2019 12:31 PM   Modules accepted: Orders

## 2019-04-26 NOTE — Telephone Encounter (Signed)
Called patient advised as written per Dr Ernst Bowler patient verbalized understanding. Advised if she cannot get famotidine get otc as they do have a backorder. Patient states she will be seeing the GI tomorrow

## 2019-04-26 NOTE — Telephone Encounter (Signed)
Patient called stating her PCP really thinks she has GERD but she can not get them back on the phone to send her something in. Patient is calling to see if Dr Ernst Bowler will send her something in for GERD. She states she has been on dexilant before but needs something stronger even if its two different medications.    [facility] [place].

## 2019-04-26 NOTE — Telephone Encounter (Signed)
Dexilant is really the strongest proton pump inhibitor.  We could try changing to Protonix 40 mg daily and famotidine 20 mg twice daily.  Please confirm that she continues to follow with gastroenterology.  It seems that she was followed by Dr. Carolynn Comment at one point as recently as 2019.  We can certainly put in a referral for a second opinion if she would like.  Otherwise, I think she definitely needs to touch base with Dr. Willa Rough again since we have maxed out her GI management at this point.  We can also discuss this more in a tele-visit, she would like to schedule that.  I have some openings tomorrow in [place].  Salvatore Marvel, MD [facility] [place] [facility] [place]

## 2019-04-26 NOTE — Telephone Encounter (Signed)
Dr Ernst Bowler please advise or should she call PCP

## 2019-07-09 ENCOUNTER — Other Ambulatory Visit: Payer: Self-pay | Admitting: Internal Medicine

## 2019-07-09 MED ORDER — TRIMETHOPRIM 100 MG PO TABS: 100.0000 mg | ORAL_TABLET | Freq: Two times a day (BID) | ORAL | 0 refills | Status: AC

## 2019-07-20 ENCOUNTER — Other Ambulatory Visit: Payer: Self-pay | Admitting: Allergy & Immunology

## 2019-08-18 ENCOUNTER — Telehealth: Payer: Self-pay | Admitting: Allergy & Immunology

## 2019-08-18 NOTE — Telephone Encounter (Signed)
Dr. Gallagher please advise.  

## 2019-08-18 NOTE — Telephone Encounter (Signed)
Patient has an appt, tomorrow 08-19-19 and she wants to know if Dr. Ernst Bowler still wants her to come in. She has been to the GI doctor like he suggested; they found nothing. She still has spasms,regurgitation, and a cough. She wants to know if he would like her to see the ENT first and then come see him? She doesn't want to waste a copay here if he is just going to tell her to go to the ENT.

## 2019-08-19 ENCOUNTER — Ambulatory Visit: Payer: 59 | Admitting: Allergy & Immunology

## 2019-08-19 NOTE — Telephone Encounter (Signed)
Pt was notified and appointment was cancelled.

## 2019-08-19 NOTE — Telephone Encounter (Signed)
Emma Larsen and I spoke this morning and decided that she should probably see ENT before seeing me.  Salvatore Marvel, MD [facility] [place] [facility] [place]

## 2019-08-19 NOTE — Telephone Encounter (Signed)
Pt calling again, asking should she see ENT before coming to see you.  Doesn't want to have to pay a copay again.

## 2019-09-01 ENCOUNTER — Other Ambulatory Visit: Payer: Self-pay | Admitting: Family

## 2019-09-01 DIAGNOSIS — Z1231 Encounter for screening mammogram for malignant neoplasm of breast: Secondary | ICD-10-CM

## 2019-09-08 DIAGNOSIS — J385 Laryngeal spasm: Secondary | ICD-10-CM | POA: Insufficient documentation

## 2019-09-08 DIAGNOSIS — R490 Dysphonia: Secondary | ICD-10-CM | POA: Insufficient documentation

## 2019-09-08 DIAGNOSIS — R053 Chronic cough: Secondary | ICD-10-CM | POA: Insufficient documentation

## 2019-09-13 ENCOUNTER — Other Ambulatory Visit: Payer: Self-pay

## 2019-09-13 NOTE — Telephone Encounter (Signed)
Dr. Ernst Bowler please advise an alternative.

## 2019-09-13 NOTE — Telephone Encounter (Signed)
Patient is calling to get a refill on her allergy medication. She states the allegra isn't working anymore and would like to try something else.    [facility]

## 2019-09-16 NOTE — Telephone Encounter (Signed)
Has she tried Xyzal yet?  It looks like she has tried RyVent as well as Karbinal in the past.  She has now failed Allegra.  I assume she has tried Claritin and Zyrtec.  Salvatore Marvel, MD [facility] [place] [facility] [place]

## 2019-09-20 ENCOUNTER — Other Ambulatory Visit: Payer: Self-pay | Admitting: *Deleted

## 2019-09-20 MED ORDER — LEVOCETIRIZINE DIHYDROCHLORIDE 5 MG PO TABS: 5.0000 mg | ORAL_TABLET | Freq: Two times a day (BID) | ORAL | 5 refills | Status: DC

## 2019-09-20 NOTE — Telephone Encounter (Signed)
Patient is having thick mucus with post nasal drip.  She seen an ENT at [place] and was diagnosed with a Neurogenic cough. They she may starting getting injections for.  Patient is going to send records over with this information.  Hasn't tried xyzal yet and would like to go ahead and do so.  Please Advise.

## 2019-09-20 NOTE — Telephone Encounter (Signed)
Please send in Xyzal 5mg  BID.  Salvatore Marvel, MD [facility] [place] [facility] [place]

## 2019-09-20 NOTE — Telephone Encounter (Signed)
Medication has been sent in. Called patient and advised. Patient verbalized understanding.  

## 2019-10-19 ENCOUNTER — Ambulatory Visit: Payer: 59

## 2019-10-19 DIAGNOSIS — K222 Esophageal obstruction: Secondary | ICD-10-CM | POA: Insufficient documentation

## 2019-11-09 DIAGNOSIS — R0989 Other specified symptoms and signs involving the circulatory and respiratory systems: Secondary | ICD-10-CM | POA: Insufficient documentation

## 2019-11-09 DIAGNOSIS — L299 Pruritus, unspecified: Secondary | ICD-10-CM | POA: Insufficient documentation

## 2019-12-02 ENCOUNTER — Ambulatory Visit: Payer: 59 | Admitting: Podiatry

## 2019-12-02 ENCOUNTER — Encounter: Payer: Self-pay | Admitting: Podiatry

## 2019-12-02 ENCOUNTER — Other Ambulatory Visit: Payer: Self-pay

## 2019-12-02 VITALS — BP 144/93 | HR 92 | Resp 16

## 2019-12-02 DIAGNOSIS — Z9109 Other allergy status, other than to drugs and biological substances: Secondary | ICD-10-CM | POA: Insufficient documentation

## 2019-12-02 DIAGNOSIS — L600 Ingrowing nail: Secondary | ICD-10-CM | POA: Diagnosis not present

## 2019-12-02 DIAGNOSIS — K5900 Constipation, unspecified: Secondary | ICD-10-CM | POA: Insufficient documentation

## 2019-12-02 MED ORDER — NEOMYCIN-POLYMYXIN-HC 1 % OT SOLN: OTIC | 1 refills | Status: DC

## 2019-12-02 NOTE — Patient Instructions (Signed)
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
## 2019-12-03 ENCOUNTER — Ambulatory Visit: Payer: 59

## 2019-12-07 ENCOUNTER — Ambulatory Visit: Payer: 59

## 2019-12-16 ENCOUNTER — Other Ambulatory Visit: Payer: Self-pay

## 2019-12-16 ENCOUNTER — Encounter: Payer: Self-pay | Admitting: Podiatry

## 2019-12-16 ENCOUNTER — Ambulatory Visit: Payer: 59 | Admitting: Podiatry

## 2019-12-16 DIAGNOSIS — Z9889 Other specified postprocedural states: Secondary | ICD-10-CM

## 2019-12-16 DIAGNOSIS — L600 Ingrowing nail: Secondary | ICD-10-CM

## 2019-12-18 NOTE — Progress Notes (Signed)
She presents today for follow-up of matrixectomy hallux right.  States that is feeling fine my foot is just dark around it.  Objective: Vital signs are stable alert oriented x3 hallux right appears to be healing very nicely I see no signs of infection.  Assessment: Well-healing surgical foot.  Plan: Continue soak until completely resolved. Follow-up with me as needed.

## 2020-01-14 ENCOUNTER — Ambulatory Visit: Payer: 59

## 2020-01-17 ENCOUNTER — Ambulatory Visit: Payer: 59

## 2020-01-20 ENCOUNTER — Other Ambulatory Visit: Payer: Self-pay

## 2020-01-20 ENCOUNTER — Ambulatory Visit
Admission: RE | Admit: 2020-01-20 | Discharge: 2020-01-20 | Disposition: A | Discharge status: Home / Self Care | Payer: 59 | Source: Ambulatory Visit | Attending: Family | Admitting: Family

## 2020-01-20 DIAGNOSIS — Z1231 Encounter for screening mammogram for malignant neoplasm of breast: Secondary | ICD-10-CM

## 2020-01-21 ENCOUNTER — Other Ambulatory Visit: Payer: Self-pay | Admitting: Family

## 2020-01-21 DIAGNOSIS — R928 Other abnormal and inconclusive findings on diagnostic imaging of breast: Secondary | ICD-10-CM

## 2020-02-08 ENCOUNTER — Ambulatory Visit
Admission: RE | Admit: 2020-02-08 | Discharge: 2020-02-08 | Disposition: A | Discharge status: Home / Self Care | Payer: 59 | Source: Ambulatory Visit | Attending: Family | Admitting: Family

## 2020-02-08 ENCOUNTER — Other Ambulatory Visit: Payer: Self-pay

## 2020-02-08 ENCOUNTER — Ambulatory Visit: Payer: 59

## 2020-02-08 DIAGNOSIS — R928 Other abnormal and inconclusive findings on diagnostic imaging of breast: Secondary | ICD-10-CM

## 2020-08-09 ENCOUNTER — Other Ambulatory Visit: Payer: Self-pay | Admitting: Allergy & Immunology

## 2021-03-07 DIAGNOSIS — N951 Menopausal and female climacteric states: Secondary | ICD-10-CM | POA: Insufficient documentation

## 2021-03-07 DIAGNOSIS — M778 Other enthesopathies, not elsewhere classified: Secondary | ICD-10-CM | POA: Insufficient documentation

## 2021-03-15 ENCOUNTER — Other Ambulatory Visit: Payer: Self-pay | Admitting: Family

## 2021-03-15 DIAGNOSIS — Z1231 Encounter for screening mammogram for malignant neoplasm of breast: Secondary | ICD-10-CM

## 2021-04-12 ENCOUNTER — Ambulatory Visit (INDEPENDENT_AMBULATORY_CARE_PROVIDER_SITE_OTHER): Payer: 59 | Admitting: Allergy & Immunology

## 2021-04-12 ENCOUNTER — Other Ambulatory Visit: Payer: Self-pay

## 2021-04-12 ENCOUNTER — Encounter: Payer: Self-pay | Admitting: Allergy & Immunology

## 2021-04-12 VITALS — BP 122/82 | HR 90 | Temp 98.2°F | Resp 16 | Ht 62.0 in | Wt 154.2 lb

## 2021-04-12 DIAGNOSIS — J3089 Other allergic rhinitis: Secondary | ICD-10-CM

## 2021-04-12 DIAGNOSIS — R059 Cough, unspecified: Secondary | ICD-10-CM | POA: Diagnosis not present

## 2021-04-12 DIAGNOSIS — J302 Other seasonal allergic rhinitis: Secondary | ICD-10-CM | POA: Diagnosis not present

## 2021-04-12 DIAGNOSIS — L508 Other urticaria: Secondary | ICD-10-CM | POA: Diagnosis not present

## 2021-04-12 MED ORDER — XHANCE 93 MCG/ACT NA EXHU: 1.0000 | INHALANT_SUSPENSION | Freq: Two times a day (BID) | NASAL | 5 refills | Status: DC

## 2021-04-12 MED ORDER — EPINEPHRINE 0.3 MG/0.3ML IJ SOAJ: INTRAMUSCULAR | 1 refills | Status: DC

## 2021-04-12 MED ORDER — FAMOTIDINE 20 MG PO TABS: 20.0000 mg | ORAL_TABLET | Freq: Two times a day (BID) | ORAL | 5 refills | Status: DC

## 2021-04-12 MED ORDER — CETIRIZINE HCL 10 MG PO TABS: ORAL_TABLET | ORAL | 5 refills | Status: DC

## 2021-04-12 NOTE — Progress Notes (Signed)
FOLLOW UP  Date of Service/Encounter:  04/12/21   Assessment:   Seasonal and perennial allergic rhinitis(trees, weeds, grasses, indoor molds, dust mites, cat, dog and cockroach)- was briefly on allergen immunotherapy today  Adverse food reaction(seafood) - with negative skin testing to seafood and now tolerates  Shortness of breath - diagnosed as neurogenic cough  New onset chronic urticaria    Patient presents for an evaluation of a new problem today. We have not seen her in almost 2 1/2 years, but her other symptoms seem to be under fair control. The cough continues to be a problem, but she seems to have developed some coping mechanisms to manage it. We are going to get some lab work to rule out serious causes of her to difficult to control urticaria and angioedema. She is requesting some prednisone today which I think is perfectly reasonable. We are starting her on suppressive doses of antihistamines to get these under control. Future management decisions will be made pending her lab results.    Plan/Recommendations:   1. Seasonal and perennial allergic rhinitis - Restart the antihistamines as below for your hives.  - Restart the Xhance one spray per nostril twice daily (can decrease to once daily when you are feeling better).  - Go ahead and stop the montelukast since you did not see a difference. - Eucrisa samples provided for the left ear lesion.   2. Cough - Continue to follow with your GI doctor.  - Maybe go back and see Dr. Benjamine Mola again for management of the cough.   3. Chronic urticaria - Your history does not have any "red flags" such as fevers, joint pains, or permanent skin changes that would be concerning for a more serious cause of hives.  - We will get some labs to rule out serious causes of hives: complete blood count, tryptase level, alpha gal panel, chronic urticaria panel, CMP, ESR, and CRP. - Chronic hives are often times a self limited process and will  "burn themselves out" over 6-12 months, although this is not always the case.  - In the meantime, start suppressive dosing of antihistamines:   - Morning: Zyrtec (cetirizine) 10-80m (two tablets) + Pepcid (famotidine) 217m - Evening: Zyrtec (cetirizine) 10-2044mtwo tablets) + Pepcid (famotidine) 40m19m If the above is not working, try adding:  - You can change this dosing at home, decreasing the dose as needed or increasing the dosing as needed.   4. Return in about 3 months (around 07/13/2021).    Subjective:   Emma Larsen 50 y37. female presenting today for follow up of  Chief Complaint  Patient presents with  . Allergic Rhinitis     PCP put her on singular and it has not helped. As soon as she walks outside she begins to hive up.   . UrMarland Kitchenicaria    Unknown causes of hives and they are random. Has pictures.    [name] a history of the following: Patient Active Problem List   Diagnosis Date Noted  . Constipation 12/02/2019  . Allergy to environmental factors 12/02/2019  . Ear itching 11/09/2019  . Globus sensation 11/09/2019  . Schatzki's ring 10/19/2019  . Chronic cough 09/08/2019  . Dysphonia 09/08/2019  . Laryngospasm 09/08/2019  . Sinusitis, acute 07/08/2018  . Simple chronic bronchitis (HCC)[place]/24/2019  . Seasonal and perennial allergic rhinitis 02/19/2018  . SOB (shortness of breath) 02/16/2018  . Adverse food reaction 02/16/2018  . Chronic rhinitis  02/16/2018  . Gastroesophageal reflux disease 02/16/2018    History obtained from: chart review and patient.  Emma Larsen is a 50 y.o. female presenting for a sick visit.  She was last seen in February 2020.  At that time, we continued Xhance as well as olopatadine once daily and NeilMed rinses.  We did discuss restarting allergy shots as a means of long-term control.  For shortness of breath/coughing, which we felt was likely reactive airway dysfunction syndrome, we recommended following up with Dr. Benjamine Mola.she  was post return in 6 months but presents now over 2 years later.  In the interim, she went to GI who found nothing to explain her cough.  We then sent her to ENT.  Since last visit, she has mostly done well.  She presents today for onset of urticaria.  Asthma/Respiratory Symptom History: She ended up having a diagnosis of Heliobacter pylori. She was coughing the entire time even when she was under for the endoscopy. She did not have hernias or anything. She was placed on a triple antibiotic treatment for H pylori and she was diagnosed with a "silent reflux" and neurogenic cough. Cough is overall not as bad, but she has a sensitive gag reflux which triggers her cough.   Allergic Rhinitis Symptom History: Her allergic rhinitis is under fair control.  She does still think that allergy shots might be in order, but she does not restart that now.  She does use an antihistamine as well as a nasal spray on a as needed basis.  She has been having hives for three months intermittently. There is no particular trigger at all. She has episodes where she itches badly. She treats with Benadryl and symptoms improve by the next day. PCP started Singulair with minimal improvement.  She has been on prednisone.  The lesion did not leave any permanent and changes.  She does not have any accompanying symptoms including throat swelling or GI issues.  They do not seem to make coughing worse at all.  Otherwise, there have been no changes to her past medical history, surgical history, family history, or social history.    Review of Systems  Constitutional: Negative.  Negative for fever, malaise/fatigue and weight loss.  HENT: Positive for congestion. Negative for ear discharge and ear pain.        Positive for postnasal drip.  Eyes: Negative for pain, discharge and redness.  Respiratory: Positive for cough. Negative for sputum production, shortness of breath and wheezing.   Cardiovascular: Negative.  Negative for chest  pain and palpitations.  Gastrointestinal: Negative for abdominal pain, constipation, diarrhea, heartburn, nausea and vomiting.  Skin: Positive for itching and rash.  Neurological: Negative for dizziness and headaches.  Endo/Heme/Allergies: Negative for environmental allergies. Does not bruise/bleed easily.       Objective:   Blood pressure 122/82, pulse 90, temperature 98.2 F (36.8 C), resp. rate 16, height $RemoveBe'5\' 2"'lwiiyomsW$  (1.575 m), weight 154 lb 3.2 oz (69.9 kg), SpO2 98 %. Body mass index is 28.2 kg/m.   Physical Exam:  Physical Exam Constitutional:      Appearance: She is well-developed.  HENT:     Head: Normocephalic and atraumatic.     Right Ear: Tympanic membrane, ear canal and external ear normal.     Left Ear: Tympanic membrane, ear canal and external ear normal.     Nose: No nasal deformity, septal deviation, mucosal edema or rhinorrhea.     Right Turbinates: Enlarged and swollen.     Left Turbinates:  Enlarged and swollen.     Right Sinus: No maxillary sinus tenderness or frontal sinus tenderness.     Left Sinus: No maxillary sinus tenderness or frontal sinus tenderness.     Mouth/Throat:     Mouth: Mucous membranes are not pale and not dry.     Pharynx: Uvula midline.  Eyes:     General:        Right eye: No discharge.        Left eye: No discharge.     Conjunctiva/sclera: Conjunctivae normal.     Right eye: Right conjunctiva is not injected. No chemosis.    Left eye: Left conjunctiva is not injected. No chemosis.    Pupils: Pupils are equal, round, and reactive to light.  Cardiovascular:     Rate and Rhythm: Normal rate and regular rhythm.     Heart sounds: Normal heart sounds.  Pulmonary:     Effort: Pulmonary effort is normal. No tachypnea, accessory muscle usage or respiratory distress.     Breath sounds: Normal breath sounds. No wheezing, rhonchi or rales.     Comments: Moving air well in all lung fields.  No increased work of breathing. Chest:     Chest wall:  No tenderness.  Lymphadenopathy:     Cervical: No cervical adenopathy.  Skin:    General: Skin is warm.     Capillary Refill: Capillary refill takes less than 2 seconds.     Coloration: Skin is not pale.     Findings: Rash present. No abrasion, erythema or petechiae. Rash is urticarial. Rash is not papular or vesicular.     Comments: Urticarial rash over her bilateral arms as well as some on her neck and face.   Neurological:     Mental Status: She is alert.  Psychiatric:        Behavior: Behavior is cooperative.      Diagnostic studies: labs sent instead    Salvatore Marvel, MD  [facility]

## 2021-04-12 NOTE — Patient Instructions (Addendum)
1. Seasonal and perennial allergic rhinitis - Restart the antihistamines as below for your hives.  - Restart the Xhance one spray per nostril twice daily (can decrease to once daily when you are feeling better).  - Go ahead and stop the montelukast since you did not see a difference. - Eucrisa samples provided for the left ear lesion.   2. Cough - Continue to follow with your GI doctor.  - Maybe go back and see Dr. Benjamine Mola again for management of the cough.   3. Chronic urticaria - Your history does not have any "red flags" such as fevers, joint pains, or permanent skin changes that would be concerning for a more serious cause of hives.  - We will get some labs to rule out serious causes of hives: complete blood count, tryptase level, alpha gal panel, chronic urticaria panel, CMP, ESR, and CRP. - Chronic hives are often times a self limited process and will "burn themselves out" over 6-12 months, although this is not always the case.  - In the meantime, start suppressive dosing of antihistamines:   - Morning: Zyrtec (cetirizine) 10-56m (two tablets) + Pepcid (famotidine) 273m - Evening: Zyrtec (cetirizine) 10-2017mtwo tablets) + Pepcid (famotidine) 77m34m If the above is not working, try adding:  - You can change this dosing at home, decreasing the dose as needed or increasing the dosing as needed.   4. Return in about 3 months (around 07/13/2021).    Please inform us oKoreaany Emergency Department visits, hospitalizations, or changes in symptoms. Call us bKoreaore going to the ED for breathing or allergy symptoms since we might be able to fit you in for a sick visit. Feel free to contact us aKoreatime with any questions, problems, or concerns.  It was a pleasure to meet you today!  Websites that have reliable patient information: 1. American Academy of Asthma, Allergy, and Immunology: www.aaaai.org 2. Food Allergy Research and Education (FARE): foodallergy.org 3. Mothers of Asthmatics:  http://www.asthmacommunitynetwork.org 4. American College of Allergy, Asthma, and Immunology: www.acaai.org   COVID-19 Vaccine Information can be found at: [email or web address] questions related to vaccine distribution or appointments, please email [email or web address] or call [phone] realize that you might be concerned about having an allergic reaction to the COVID19 vaccines. To help with that concern, WE ARE OFFERING THE COVID19 VACCINES IN OUR OFFICE! Ask the front desk for dates!     "Like" us o[place]oreaFacebook and Instagram for our latest updates!      A healthy democracy works best when ALL [employer] [place] [employer]ate! Make sure you are registered to vote! If you have moved or changed any of your contact information, you will need to get this updated before voting!  In some cases, you MAY be able to register to vote online: [email or web address]

## 2021-04-13 ENCOUNTER — Telehealth: Payer: Self-pay | Admitting: Allergy & Immunology

## 2021-04-13 ENCOUNTER — Encounter: Payer: Self-pay | Admitting: Allergy & Immunology

## 2021-04-13 NOTE — Telephone Encounter (Signed)
Informed pt we have all labs back except the alpha gal an that once we receive it Dr Dellis Anes will review them and we will contact her. She stated understanding

## 2021-04-13 NOTE — Telephone Encounter (Signed)
Patient was seen and had blood work yesterday, 04/12/21. She said she was told it would take anywhere from 2-5 weeks for the results. She said the results were sent to her MyChart today and she would like someone to go over them with her. I told her it was likely that Dr. Dellis Anes hasn't read them yet and until he does and signs off on them, the nurses cannot give the results. I told her he was in the [place] office today. She wanted to know if she would be called back today.

## 2021-04-18 LAB — ALPHA-GAL PANEL
Allergen Lamb IgE: <0.1 kU/L
Beef IgE: <0.1 kU/L
IgE (Immunoglobulin E), Serum: 282 IU/mL (ref 6–495)
O215-IgE Alpha-Gal: <0.1 kU/L
Pork IgE: <0.1 kU/L

## 2021-04-18 NOTE — Telephone Encounter (Signed)
Pt called stating she received her labs back. I did let patient know that as soon as the doctor is able to see them a nurse would give her a call back. Patient said she did receive the alpha gal labs that were remaining & wanted a nurse to read them to her, I explained to patient she received them the same time as the doctor and we can not release any information regarding labs unless the doctor results them. Patient verbalized understanding.

## 2021-04-25 LAB — CBC WITH DIFFERENTIAL
Basophils Absolute: 0 10*3/uL (ref 0.0–0.2)
Basos: 1 %
EOS (ABSOLUTE): 0.3 10*3/uL (ref 0.0–0.4)
Eos: 4 %
Hematocrit: 43.9 % (ref 34.0–46.6)
Hemoglobin: 14.6 g/dL (ref 11.1–15.9)
Immature Grans (Abs): 0 10*3/uL (ref 0.0–0.1)
Immature Granulocytes: 0 %
Lymphocytes Absolute: 2.6 10*3/uL (ref 0.7–3.1)
Lymphs: 36 %
MCH: 28.9 pg (ref 26.6–33.0)
MCHC: 33.3 g/dL (ref 31.5–35.7)
MCV: 87 fL (ref 79–97)
Monocytes Absolute: 0.4 10*3/uL (ref 0.1–0.9)
Monocytes: 6 %
Neutrophils Absolute: 3.8 10*3/uL (ref 1.4–7.0)
Neutrophils: 53 %
RBC: 5.06 x10E6/uL (ref 3.77–5.28)
RDW: 12 % (ref 11.7–15.4)
WBC: 7.1 10*3/uL (ref 3.4–10.8)

## 2021-04-25 LAB — CMP14+EGFR
ALT: 19 IU/L (ref 0–32)
AST: 20 IU/L (ref 0–40)
Albumin/Globulin Ratio: 1.5 (ref 1.2–2.2)
Albumin: 4.6 g/dL (ref 3.8–4.8)
Alkaline Phosphatase: 48 IU/L (ref 44–121)
BUN/Creatinine Ratio: 10 (ref 9–23)
BUN: 9 mg/dL (ref 6–24)
Bilirubin Total: <0.2 mg/dL (ref 0.0–1.2)
CO2: 22 mmol/L (ref 20–29)
Calcium: 9.2 mg/dL (ref 8.7–10.2)
Chloride: 103 mmol/L (ref 96–106)
Creatinine, Ser: 0.87 mg/dL (ref 0.57–1.00)
Globulin, Total: 3.1 g/dL (ref 1.5–4.5)
Glucose: 82 mg/dL (ref 65–99)
Potassium: 3.9 mmol/L (ref 3.5–5.2)
Sodium: 142 mmol/L (ref 134–144)
Total Protein: 7.7 g/dL (ref 6.0–8.5)
eGFR: 82 mL/min/{1.73_m2} (ref >59)

## 2021-04-25 LAB — ALLERGEN PROFILE, BASIC FOOD
Allergen Corn, IgE: <0.1 kU/L
Beef IgE: <0.1 kU/L
Chocolate/Cacao IgE: <0.1 kU/L
Egg, Whole IgE: <0.1 kU/L
Food Mix (Seafoods) IgE: POSITIVE — AB
Milk IgE: <0.1 kU/L
Peanut IgE: <0.1 kU/L
Pork IgE: <0.1 kU/L
Soybean IgE: <0.1 kU/L
Wheat IgE: <0.1 kU/L

## 2021-04-25 LAB — C-REACTIVE PROTEIN: CRP: <1 mg/L (ref 0–10)

## 2021-04-25 LAB — CHRONIC URTICARIA: cu index: 12.1 — ABNORMAL HIGH (ref <10)

## 2021-04-25 LAB — SEDIMENTATION RATE: Sed Rate: 32 mm/hr (ref 0–32)

## 2021-04-25 LAB — TRYPTASE: Tryptase: 6 ug/L (ref 2.2–13.2)

## 2021-04-25 LAB — ANTINUCLEAR ANTIBODIES, IFA: ANA Titer 1: NEGATIVE

## 2021-04-30 DIAGNOSIS — H6123 Impacted cerumen, bilateral: Secondary | ICD-10-CM | POA: Insufficient documentation

## 2021-05-02 ENCOUNTER — Telehealth: Payer: Self-pay

## 2021-05-02 NOTE — Telephone Encounter (Signed)
Called patient and discussed Xolair for hives and patient wants to proceed.  I advised approval, copay card, submit to [facility] and will reach out when delivery set to make appt to start therapy

## 2021-05-02 NOTE — Telephone Encounter (Signed)
Patient called and wanted conformation on her My Chart message from Dr. Dellis Anes about her possibly starting Xolair injections. Please advise on what her next step so staff can inform her on what her options will be. Thank you.

## 2021-05-03 NOTE — Telephone Encounter (Signed)
Great - thanks for the update.  Savir Blanke, MD [facility]  

## 2021-05-08 ENCOUNTER — Ambulatory Visit (INDEPENDENT_AMBULATORY_CARE_PROVIDER_SITE_OTHER): Payer: 59

## 2021-05-08 ENCOUNTER — Ambulatory Visit: Payer: 59

## 2021-05-08 ENCOUNTER — Other Ambulatory Visit: Payer: Self-pay

## 2021-05-08 DIAGNOSIS — L501 Idiopathic urticaria: Secondary | ICD-10-CM | POA: Diagnosis not present

## 2021-05-08 MED ORDER — OMALIZUMAB 150 MG/ML ~~LOC~~ SOSY
300.0000 mg | PREFILLED_SYRINGE | SUBCUTANEOUS | Status: AC
  Administered 2021-05-08 – 2024-12-17 (×40): 300 mg via SUBCUTANEOUS

## 2021-05-08 NOTE — Progress Notes (Signed)
Immunotherapy   Patient Details  Name: Emma Larsen MRN: [id number] Date of Birth: 02-Feb-1971  05/08/2021  [name] here to start Xolair for Urticaria. Patient received 300 mg dose.  Frequency: every 28 days Epi-Pen:Epi-Pen Available  Consent signed and patient instructions given.   Dub Mikes 05/08/2021, 5:17 PM

## 2021-05-10 ENCOUNTER — Inpatient Hospital Stay: Admission: RE | Admit: 2021-05-10 | Payer: 59 | Source: Ambulatory Visit

## 2021-05-10 DIAGNOSIS — Z1231 Encounter for screening mammogram for malignant neoplasm of breast: Secondary | ICD-10-CM

## 2021-05-16 ENCOUNTER — Telehealth: Payer: Self-pay

## 2021-05-16 NOTE — Telephone Encounter (Signed)
Pa Submitted thru cover my meds for xhance

## 2021-05-17 NOTE — Telephone Encounter (Signed)
Pa was denied please advise to change to flonase?

## 2021-05-18 NOTE — Telephone Encounter (Signed)
Pt stated she was on flonase several years back

## 2021-05-18 NOTE — Telephone Encounter (Signed)
OK we can give it another whirl.  Malachi Bonds, MD [facility] [employer]

## 2021-05-18 NOTE — Telephone Encounter (Signed)
Have we talked to [place] about this?   We can try Flonase, but I am 100% sure if she has tried this.  Malachi Bonds, MD [facility]

## 2021-05-21 MED ORDER — FLUTICASONE PROPIONATE 50 MCG/ACT NA SUSP: NASAL | 5 refills | Status: DC

## 2021-05-21 NOTE — Telephone Encounter (Signed)
Sent in flonase after verifying pharmacy with opt.  Pt would like to know since the shellfish lab came back crazy do you want to order individual shellfish testing or do another scratch test?   Also she said you mentioned a topical cream for her rash but I did not see anything mentioned and she does not remember the name of the topical you wanted her to have. Please advise on what topical to send in.

## 2021-05-21 NOTE — Addendum Note (Signed)
Addended by: Berna Bue on: 05/21/2021 08:06 AM   Modules accepted: Orders

## 2021-06-05 ENCOUNTER — Other Ambulatory Visit: Payer: Self-pay

## 2021-06-05 ENCOUNTER — Ambulatory Visit (INDEPENDENT_AMBULATORY_CARE_PROVIDER_SITE_OTHER): Payer: 59 | Admitting: *Deleted

## 2021-06-05 DIAGNOSIS — L501 Idiopathic urticaria: Secondary | ICD-10-CM | POA: Diagnosis not present

## 2021-06-05 DIAGNOSIS — L508 Other urticaria: Secondary | ICD-10-CM

## 2021-07-03 ENCOUNTER — Other Ambulatory Visit: Payer: Self-pay

## 2021-07-03 ENCOUNTER — Ambulatory Visit (INDEPENDENT_AMBULATORY_CARE_PROVIDER_SITE_OTHER): Payer: 59 | Admitting: *Deleted

## 2021-07-03 DIAGNOSIS — L501 Idiopathic urticaria: Secondary | ICD-10-CM | POA: Diagnosis not present

## 2021-07-04 ENCOUNTER — Ambulatory Visit
Admission: RE | Admit: 2021-07-04 | Discharge: 2021-07-04 | Disposition: A | Discharge status: Home / Self Care | Payer: 59 | Source: Ambulatory Visit | Attending: Family | Admitting: Family

## 2021-07-04 DIAGNOSIS — Z1231 Encounter for screening mammogram for malignant neoplasm of breast: Secondary | ICD-10-CM

## 2021-07-17 ENCOUNTER — Encounter: Payer: Self-pay | Admitting: Allergy & Immunology

## 2021-07-17 ENCOUNTER — Ambulatory Visit: Payer: 59 | Admitting: Allergy & Immunology

## 2021-07-17 ENCOUNTER — Other Ambulatory Visit: Payer: Self-pay

## 2021-07-17 VITALS — BP 124/80 | HR 87 | Temp 98.7°F | Resp 18 | Ht 62.0 in | Wt 158.2 lb

## 2021-07-17 DIAGNOSIS — J3089 Other allergic rhinitis: Secondary | ICD-10-CM | POA: Diagnosis not present

## 2021-07-17 DIAGNOSIS — J329 Chronic sinusitis, unspecified: Secondary | ICD-10-CM

## 2021-07-17 DIAGNOSIS — L501 Idiopathic urticaria: Secondary | ICD-10-CM | POA: Diagnosis not present

## 2021-07-17 DIAGNOSIS — R059 Cough, unspecified: Secondary | ICD-10-CM

## 2021-07-17 DIAGNOSIS — J302 Other seasonal allergic rhinitis: Secondary | ICD-10-CM

## 2021-07-17 MED ORDER — GABAPENTIN 300 MG PO CAPS: 300.0000 mg | ORAL_CAPSULE | Freq: Three times a day (TID) | ORAL | 3 refills | Status: DC

## 2021-07-17 MED ORDER — IPRATROPIUM BROMIDE 0.03 % NA SOLN: 2.0000 | Freq: Three times a day (TID) | NASAL | 5 refills | Status: DC

## 2021-07-17 NOTE — Progress Notes (Signed)
FOLLOW UP  Date of Service/Encounter:  07/17/21   Assessment:   Seasonal and perennial allergic rhinitis (trees, weeds, grasses, indoor molds, dust mites, cat, dog and cockroach) - was briefly on allergen immunotherapy today   Adverse food reaction (seafood) - with negative skin testing to seafood and now tolerates   Shortness of breath - diagnosed as neurogenic cough   New onset chronic urticaria     Plan/Recommendations:   1. Seasonal and perennial allergic rhinitis - Continue with the antihistamines as below for your hives.  - Continue the Xhance one spray per nostril twice daily (can decrease to once daily when you are feeling better).  - STOP the fluticasone. - Add ipratropium nasal spray one spray per nostril up three times daily.  - We ordered a sinus CT (we might have to jump through some hoops to get this approved).   2. Cough - Continue to follow with your GI doctor.  - Add gabapentin 300mg  once at night (can increase to three times daily).  3. Chronic autoimmune urticaria  - Continue with Xolair monthly as you are doing.  - Wean the cetirizine and famotidine to to once daily.  4. Return in about 6 months (around 01/14/2022).     Subjective:   Emma Larsen is a 50 y.o. female presenting today for follow up of  Chief Complaint  Patient presents with   Follow-up    Emma Larsen has a history of the following: Patient Active Problem List   Diagnosis Date Noted   Constipation 12/02/2019   Allergy to environmental factors 12/02/2019   Ear itching 11/09/2019   Globus sensation 11/09/2019   Schatzki's ring 10/19/2019   Chronic cough 09/08/2019   Dysphonia 09/08/2019   Laryngospasm 09/08/2019   Sinusitis, acute 07/08/2018   Simple chronic bronchitis (HCC) 06/03/2018   Seasonal and perennial allergic rhinitis 02/19/2018   SOB (shortness of breath) 02/16/2018   Adverse food reaction 02/16/2018   Chronic rhinitis 02/16/2018   Gastroesophageal reflux  disease 02/16/2018    History obtained from: chart review and patient.  Emma Larsen is a 50 y.o. female presenting for a follow up visit.  She was last seen in June 2022.  At that time, she was restarted on antihistamines as well as the [facility].  We went ahead and stop the montelukast since she had noticed no difference.  For the cough, we recommended continued follow-up with her GI doctor with possible follow-up to [facility].  For her new onset urticaria, we obtained a bunch of labs and put her on an H1/H2 blockade.  Her large lab work-up was notable only for a positive seafood panel.  Her chronic urticaria panel was also mildly elevated.   Respiratory Symptom History: She continues to have a cough, although it was not as bad as it once was. She has never tried gabapentin and is interested in doing so.   Allergic Rhinitis Symptom History: She is on the cetirizine BID as well as the Xhance and another fluticasone. She has a lot of postnasal drip.  She has had sinus infections multiple times this year, at least three. She had one in April 2022, July 2022, and one in August 2022. She can tell when they are coming on because she develops teeth pain. She then increases her nose sprays before making an appointment to get antibiotics. She has seen ENT in the past, but it was Dr. September 2022 for treatment of her vocal cord dysfunction. This did help a bit  with her cough.  Food Allergy Symptom History: Depiste the positive testing to the seafood panel, she continues to eat it. She reminds me today that it was negative when I tested her once and she had put it back into her diet. She continues to eat it without a problem despite the positive lab work. It should be noted that the lab did was more qualitative than quantitative - it only showed whether it was positive or not, there was no titer.   Skin Symptom History: She is doing the Xolair every 28 days and this seemed to be working well.  She has not had a breakout from  her hives. We do spend some time discussing her lab results today.   Otherwise, there have been no changes to her past medical history, surgical history, family history, or social history.    Review of Systems  Constitutional: Negative.  Negative for chills, fever, malaise/fatigue and weight loss.  HENT:  Positive for sinus pain. Negative for congestion, ear discharge and ear pain.   Eyes:  Negative for pain, discharge and redness.  Respiratory:  Positive for cough. Negative for sputum production, shortness of breath and wheezing.   Cardiovascular: Negative.  Negative for chest pain and palpitations.  Gastrointestinal:  Negative for abdominal pain, constipation, diarrhea, heartburn, nausea and vomiting.  Skin: Negative.  Negative for itching and rash.  Neurological:  Negative for dizziness and headaches.  Endo/Heme/Allergies:  Positive for environmental allergies. Does not bruise/bleed easily.      Objective:   Blood pressure 124/80, pulse 87, temperature 98.7 F (37.1 C), temperature source Temporal, resp. rate 18, height 5\' 2"  (1.575 m), weight 158 lb 4 oz (71.8 kg), SpO2 98 %. Body mass index is 28.94 kg/m.   Physical Exam:  Physical Exam Vitals reviewed.  Constitutional:      Appearance: She is well-developed.     Comments: Talkative.  HENT:     Head: Normocephalic and atraumatic.     Right Ear: Tympanic membrane, ear canal and external ear normal.     Left Ear: Tympanic membrane, ear canal and external ear normal.     Nose: No nasal deformity, septal deviation, mucosal edema or rhinorrhea.     Right Turbinates: Enlarged and swollen.     Left Turbinates: Enlarged and swollen.     Right Sinus: Maxillary sinus tenderness and frontal sinus tenderness present.     Left Sinus: Maxillary sinus tenderness and frontal sinus tenderness present.     Mouth/Throat:     Mouth: Mucous membranes are not pale and not dry.     Pharynx: Uvula midline.  Eyes:     General: Lids are  normal. No allergic shiner.       Right eye: No discharge.        Left eye: No discharge.     Conjunctiva/sclera: Conjunctivae normal.     Right eye: Right conjunctiva is not injected. No chemosis.    Left eye: Left conjunctiva is not injected. No chemosis.    Pupils: Pupils are equal, round, and reactive to light.  Cardiovascular:     Rate and Rhythm: Normal rate and regular rhythm.     Heart sounds: Normal heart sounds.  Pulmonary:     Effort: Pulmonary effort is normal. No tachypnea, accessory muscle usage or respiratory distress.     Breath sounds: Normal breath sounds. No wheezing, rhonchi or rales.     Comments: Moving air well in all lung fields. Chest:  Chest wall: No tenderness.  Lymphadenopathy:     Cervical: No cervical adenopathy.  Skin:    General: Skin is warm.     Capillary Refill: Capillary refill takes less than 2 seconds.     Coloration: Skin is not pale.     Findings: No abrasion, erythema, petechiae or rash. Rash is not papular, urticarial or vesicular.     Comments: No urticaria  Neurological:     Mental Status: She is alert.  Psychiatric:        Behavior: Behavior is cooperative.     Diagnostic studies: none       Malachi Bonds, MD  [facility] [place]

## 2021-07-17 NOTE — Patient Instructions (Addendum)
1. Seasonal and perennial allergic rhinitis - Continue with the antihistamines as below for your hives.  - Continue the Xhance one spray per nostril twice daily (can decrease to once daily when you are feeling better).  - STOP the fluticasone. - Add ipratropium nasal spray one spray per nostril up three times daily.  - We ordered a sinus CT (we might have to jump through some hoops to get this approved).   2. Cough - Continue to follow with your GI doctor.  - Add gabapentin 300mg  once at night (can increase to three times daily).  3. Chronic autoimmune urticaria  - Continue with Xolair monthly as you are doing.  - Wean the cetirizine and famotidine to to once daily.  4. Return in about 6 months (around 01/14/2022).    Please inform 03/16/2022 of any Emergency Department visits, hospitalizations, or changes in symptoms. Call us before going to the ED for breathing or allergy symptoms since we might be able to fit you in for a sick visit. Feel free to contact us anytime with any questions, problems, or concerns.  It was a pleasure to meet you today!  Websites that have reliable patient information: 1. American Academy of Asthma, Allergy, and Immunology: www.aaaai.org 2. Food Allergy Research and Education (FARE): foodallergy.org 3. Mothers of Asthmatics: http://www.asthmacommunitynetwork.org 4. American College of Allergy, Asthma, and Immunology: www.acaai.org   COVID-19 Vaccine Information can be found at: [place] For questions related to vaccine distribution or appointments, please email [email or web address] or call [phone].   We realize that you might be concerned about having an allergic reaction to the COVID19 vaccines. To help with that concern, WE ARE OFFERING THE COVID19 VACCINES IN OUR OFFICE! Ask the front desk for dates!     "Like" [phone] on Facebook and Instagram for our latest updates!      A healthy  democracy works best when [place] participate! Make sure you are registered to vote! If you have moved or changed any of your contact information, you will need to get this updated before voting!  In some cases, you MAY be able to register to vote online: [employer]

## 2021-07-19 ENCOUNTER — Encounter: Payer: Self-pay | Admitting: Allergy & Immunology

## 2021-07-25 ENCOUNTER — Telehealth: Payer: Self-pay

## 2021-07-25 NOTE — Telephone Encounter (Signed)
Latonya from [facility] called stating the patient will need a prior auth done by Friday as she is scheduled for Saturday to do Maxillofacial CT w/O Contrast.  Insurance: [employer] [phone] EXT [phone] LATONYA

## 2021-07-25 NOTE — Telephone Encounter (Signed)
Case # [id number] approved until 09/08/2021 approval #  [id number] lm for Adair Laundry about the approval to call us if she had any issues.

## 2021-07-28 ENCOUNTER — Other Ambulatory Visit: Payer: Self-pay

## 2021-07-28 ENCOUNTER — Ambulatory Visit
Admission: RE | Admit: 2021-07-28 | Discharge: 2021-07-28 | Disposition: A | Discharge status: Home / Self Care | Payer: 59 | Source: Ambulatory Visit | Attending: Allergy & Immunology | Admitting: Allergy & Immunology

## 2021-07-31 ENCOUNTER — Ambulatory Visit (INDEPENDENT_AMBULATORY_CARE_PROVIDER_SITE_OTHER): Payer: 59

## 2021-07-31 ENCOUNTER — Other Ambulatory Visit: Payer: Self-pay

## 2021-07-31 DIAGNOSIS — L501 Idiopathic urticaria: Secondary | ICD-10-CM | POA: Diagnosis not present

## 2021-07-31 NOTE — Telephone Encounter (Signed)
While patient came in for her injection, she stopped by to ask if Dr. Dellis Anes could go more in detail about her CT results. Patient was able to see message Dr. Dellis Anes sent through [facility] but would like to speak to him more in depth about what he meant by deformities.    Best contact number: [phone]

## 2021-08-01 NOTE — Telephone Encounter (Signed)
Patient called back and was informed of the e-mail. Patient verbalized understanding.

## 2021-08-01 NOTE — Telephone Encounter (Signed)
I sent the patient an email with more detail. Can you call; the patient to let her know?   I sent it to [email or web address]. She might need to check the junk mail.   Thanks!   Malachi Bonds, MD [facility]

## 2021-08-01 NOTE — Telephone Encounter (Signed)
Called and left a voicemail asking for patient to return call to inform.  

## 2021-08-28 ENCOUNTER — Ambulatory Visit: Payer: 59

## 2021-08-28 DIAGNOSIS — G43009 Migraine without aura, not intractable, without status migrainosus: Secondary | ICD-10-CM | POA: Insufficient documentation

## 2021-08-29 ENCOUNTER — Ambulatory Visit: Payer: 59

## 2021-08-31 ENCOUNTER — Ambulatory Visit: Payer: 59

## 2021-09-07 ENCOUNTER — Other Ambulatory Visit: Payer: Self-pay

## 2021-09-07 ENCOUNTER — Ambulatory Visit (INDEPENDENT_AMBULATORY_CARE_PROVIDER_SITE_OTHER): Payer: 59

## 2021-09-07 DIAGNOSIS — L501 Idiopathic urticaria: Secondary | ICD-10-CM

## 2021-09-11 ENCOUNTER — Ambulatory Visit: Payer: 59 | Admitting: Podiatry

## 2021-09-11 DIAGNOSIS — J069 Acute upper respiratory infection, unspecified: Secondary | ICD-10-CM | POA: Insufficient documentation

## 2021-09-13 ENCOUNTER — Ambulatory Visit: Payer: 59 | Admitting: Podiatry

## 2021-09-15 ENCOUNTER — Other Ambulatory Visit: Payer: Self-pay

## 2021-09-15 ENCOUNTER — Emergency Department (HOSPITAL_BASED_OUTPATIENT_CLINIC_OR_DEPARTMENT_OTHER): Payer: 59 | Admitting: Radiology

## 2021-09-15 ENCOUNTER — Encounter (HOSPITAL_BASED_OUTPATIENT_CLINIC_OR_DEPARTMENT_OTHER): Payer: Self-pay | Admitting: *Deleted

## 2021-09-15 ENCOUNTER — Ambulatory Visit
Admission: EM | Admit: 2021-09-15 | Discharge: 2021-09-15 | Disposition: A | Discharge status: Home / Self Care | Payer: 59

## 2021-09-15 ENCOUNTER — Emergency Department (HOSPITAL_BASED_OUTPATIENT_CLINIC_OR_DEPARTMENT_OTHER)
Admission: EM | Admit: 2021-09-15 | Discharge: 2021-09-15 | Disposition: A | Discharge status: Home / Self Care | Payer: 59 | Attending: Emergency Medicine | Admitting: Emergency Medicine

## 2021-09-15 DIAGNOSIS — K921 Melena: Secondary | ICD-10-CM

## 2021-09-15 DIAGNOSIS — Z79899 Other long term (current) drug therapy: Secondary | ICD-10-CM | POA: Insufficient documentation

## 2021-09-15 DIAGNOSIS — Z20822 Contact with and (suspected) exposure to covid-19: Secondary | ICD-10-CM | POA: Insufficient documentation

## 2021-09-15 DIAGNOSIS — K2970 Gastritis, unspecified, without bleeding: Secondary | ICD-10-CM

## 2021-09-15 DIAGNOSIS — J111 Influenza due to unidentified influenza virus with other respiratory manifestations: Secondary | ICD-10-CM

## 2021-09-15 DIAGNOSIS — J069 Acute upper respiratory infection, unspecified: Secondary | ICD-10-CM | POA: Diagnosis not present

## 2021-09-15 DIAGNOSIS — J45909 Unspecified asthma, uncomplicated: Secondary | ICD-10-CM | POA: Diagnosis not present

## 2021-09-15 DIAGNOSIS — R059 Cough, unspecified: Secondary | ICD-10-CM

## 2021-09-15 DIAGNOSIS — R0981 Nasal congestion: Secondary | ICD-10-CM | POA: Diagnosis not present

## 2021-09-15 DIAGNOSIS — J101 Influenza due to other identified influenza virus with other respiratory manifestations: Secondary | ICD-10-CM | POA: Insufficient documentation

## 2021-09-15 LAB — URINALYSIS, ROUTINE W REFLEX MICROSCOPIC
Bilirubin Urine: NEGATIVE
Glucose, UA: NEGATIVE mg/dL
Hgb urine dipstick: NEGATIVE
Ketones, ur: NEGATIVE mg/dL
Leukocytes,Ua: NEGATIVE
Nitrite: NEGATIVE
Protein, ur: NEGATIVE mg/dL
Specific Gravity, Urine: 1.009 (ref 1.005–1.030)
pH: 6.5 (ref 5.0–8.0)

## 2021-09-15 LAB — RESP PANEL BY RT-PCR (FLU A&B, COVID) ARPGX2
Influenza A by PCR: POSITIVE — AB
Influenza B by PCR: NEGATIVE
SARS Coronavirus 2 by RT PCR: NEGATIVE

## 2021-09-15 LAB — CBC WITH DIFFERENTIAL/PLATELET
Abs Immature Granulocytes: 0.01 10*3/uL (ref 0.00–0.07)
Basophils Absolute: 0.1 10*3/uL (ref 0.0–0.1)
Basophils Relative: 1 %
Eosinophils Absolute: 0.4 10*3/uL (ref 0.0–0.5)
Eosinophils Relative: 7 %
HCT: 42.6 % (ref 36.0–46.0)
Hemoglobin: 14.1 g/dL (ref 12.0–15.0)
Immature Granulocytes: 0 %
Lymphocytes Relative: 29 %
Lymphs Abs: 1.6 10*3/uL (ref 0.7–4.0)
MCH: 28.4 pg (ref 26.0–34.0)
MCHC: 33.1 g/dL (ref 30.0–36.0)
MCV: 85.7 fL (ref 80.0–100.0)
Monocytes Absolute: 0.6 10*3/uL (ref 0.1–1.0)
Monocytes Relative: 10 %
Neutro Abs: 2.9 10*3/uL (ref 1.7–7.7)
Neutrophils Relative %: 53 %
Platelets: 267 10*3/uL (ref 150–400)
RBC: 4.97 MIL/uL (ref 3.87–5.11)
RDW: 11.5 % (ref 11.5–15.5)
WBC: 5.5 10*3/uL (ref 4.0–10.5)
nRBC: 0 % (ref 0.0–0.2)

## 2021-09-15 LAB — COMPREHENSIVE METABOLIC PANEL
ALT: 14 U/L (ref 0–44)
AST: 17 U/L (ref 15–41)
Albumin: 3.9 g/dL (ref 3.5–5.0)
Alkaline Phosphatase: 33 U/L — ABNORMAL LOW (ref 38–126)
Anion gap: 9 (ref 5–15)
BUN: 8 mg/dL (ref 6–20)
CO2: 26 mmol/L (ref 22–32)
Calcium: 8.7 mg/dL — ABNORMAL LOW (ref 8.9–10.3)
Chloride: 104 mmol/L (ref 98–111)
Creatinine, Ser: 0.8 mg/dL (ref 0.44–1.00)
GFR, Estimated: >60 mL/min (ref >60)
Glucose, Bld: 94 mg/dL (ref 70–99)
Potassium: 3.6 mmol/L (ref 3.5–5.1)
Sodium: 139 mmol/L (ref 135–145)
Total Bilirubin: 0.5 mg/dL (ref 0.3–1.2)
Total Protein: 7.5 g/dL (ref 6.5–8.1)

## 2021-09-15 MED ORDER — LACTATED RINGERS IV BOLUS
1000.0000 mL | Freq: Once | INTRAVENOUS | Status: AC
  Administered 2021-09-15: 1000 mL via INTRAVENOUS

## 2021-09-15 MED ORDER — PANTOPRAZOLE SODIUM 20 MG PO TBEC: 20.0000 mg | DELAYED_RELEASE_TABLET | Freq: Two times a day (BID) | ORAL | 0 refills | Status: DC

## 2021-09-15 MED ORDER — SUCRALFATE 1 G PO TABS: 1.0000 g | ORAL_TABLET | Freq: Four times a day (QID) | ORAL | 0 refills | Status: DC

## 2021-09-15 MED ORDER — BENZONATATE 100 MG PO CAPS: 100.0000 mg | ORAL_CAPSULE | Freq: Three times a day (TID) | ORAL | 0 refills | Status: DC

## 2021-09-15 MED ORDER — LACTATED RINGERS IV SOLN: INTRAVENOUS | Status: DC
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
## 2021-09-15 NOTE — ED Triage Notes (Signed)
Pt sent from UC, she has a worsening sinus infection no responding to zpack, also has has diarrhea with black tarry stools, Palpations last night, reflux symptoms.

## 2021-09-15 NOTE — ED Notes (Signed)
She remains awake, alert and comfortable, other than a mild right earache.

## 2021-09-15 NOTE — ED Triage Notes (Signed)
Pt c/o cough, sore throat, headache, body aches and chills, nausea, weakness, diarrhea (pasty and tar-like)  Denies ear ache, vomiting,   Onset a week ago   Is on last dose of a zpak today.

## 2021-09-15 NOTE — Discharge Instructions (Signed)
Please go to the emergency department as soon as you leave urgent care for further evaluation and management. ?
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
## 2021-09-20 ENCOUNTER — Ambulatory Visit: Payer: 59 | Admitting: Podiatry

## 2021-10-02 ENCOUNTER — Encounter: Payer: Self-pay | Admitting: Podiatry

## 2021-10-02 ENCOUNTER — Other Ambulatory Visit: Payer: Self-pay

## 2021-10-02 ENCOUNTER — Ambulatory Visit: Payer: 59 | Admitting: Podiatry

## 2021-10-02 DIAGNOSIS — L600 Ingrowing nail: Secondary | ICD-10-CM

## 2021-10-02 MED ORDER — NEOMYCIN-POLYMYXIN-HC 1 % OT SOLN: OTIC | 1 refills | Status: DC

## 2021-10-02 NOTE — Progress Notes (Signed)
She presents today chief complaint of ingrown nail hallux left.  States that is very medial margin is very sore.  Objective: Review past medical history medications allergies surgeries and social history.  Pulses remain palpable.  Sharp incurvated nail margin mild erythema no purulence no malodor.  Assessment: Ingrown nail paronychia abscess hallux left.  Plan: Performed chemical matricectomy to the tibial border of the hallux left today she tolerated procedure well was provided with both oral written home instructions as well as a prescription for Cortisporin Otic to be applied twice daily after soaking.  Like to follow-up with her in 1 to 2 weeks

## 2021-10-02 NOTE — Addendum Note (Signed)
Addended by: Ventura Sellers on: 10/02/2021 06:56 PM   Modules accepted: Orders

## 2021-10-02 NOTE — Patient Instructions (Signed)

## 2021-10-08 ENCOUNTER — Ambulatory Visit: Payer: 59

## 2021-10-09 ENCOUNTER — Ambulatory Visit (INDEPENDENT_AMBULATORY_CARE_PROVIDER_SITE_OTHER): Payer: 59 | Admitting: *Deleted

## 2021-10-09 DIAGNOSIS — L501 Idiopathic urticaria: Secondary | ICD-10-CM | POA: Diagnosis not present

## 2021-10-09 MED ORDER — NEOMYCIN-POLYMYXIN-HC 3.5-10000-1 OT SOLN: OTIC | 0 refills | Status: DC

## 2021-10-16 DIAGNOSIS — R030 Elevated blood-pressure reading, without diagnosis of hypertension: Secondary | ICD-10-CM | POA: Insufficient documentation

## 2021-10-18 ENCOUNTER — Ambulatory Visit: Payer: 59 | Admitting: Podiatry

## 2021-10-23 ENCOUNTER — Encounter: Payer: Self-pay | Admitting: Podiatry

## 2021-10-23 ENCOUNTER — Ambulatory Visit: Payer: 59 | Admitting: Podiatry

## 2021-10-23 ENCOUNTER — Other Ambulatory Visit: Payer: Self-pay

## 2021-10-23 DIAGNOSIS — L600 Ingrowing nail: Secondary | ICD-10-CM

## 2021-10-23 DIAGNOSIS — Z9889 Other specified postprocedural states: Secondary | ICD-10-CM

## 2021-10-24 NOTE — Progress Notes (Signed)
She presents today for follow-up of her matrixectomy hallux left.  She states that is little sore but is so much better than it was.  Objective: Vital signs are stable alert and oriented x3.  There is no erythema edema cellulitis drainage or odor appears to be healing very nicely.  No signs of infection.  Assessment: Well-healing surgical toe hallux left fibular border.  Plan: Continue soak Epson salts and warm water every other day until the soreness is resolved follow-up with me on an as-needed basis.

## 2021-11-06 ENCOUNTER — Ambulatory Visit: Payer: 59

## 2021-11-15 ENCOUNTER — Ambulatory Visit: Payer: 59

## 2021-11-19 DIAGNOSIS — G4700 Insomnia, unspecified: Secondary | ICD-10-CM | POA: Insufficient documentation

## 2021-12-05 ENCOUNTER — Ambulatory Visit: Payer: 59

## 2021-12-07 ENCOUNTER — Ambulatory Visit (INDEPENDENT_AMBULATORY_CARE_PROVIDER_SITE_OTHER): Payer: 59

## 2021-12-07 ENCOUNTER — Other Ambulatory Visit: Payer: Self-pay

## 2021-12-07 DIAGNOSIS — L501 Idiopathic urticaria: Secondary | ICD-10-CM

## 2021-12-12 ENCOUNTER — Emergency Department (HOSPITAL_BASED_OUTPATIENT_CLINIC_OR_DEPARTMENT_OTHER)
Admission: EM | Admit: 2021-12-12 | Discharge: 2021-12-12 | Disposition: A | Discharge status: Home / Self Care | Payer: 59 | Attending: Emergency Medicine | Admitting: Emergency Medicine

## 2021-12-12 ENCOUNTER — Encounter (HOSPITAL_BASED_OUTPATIENT_CLINIC_OR_DEPARTMENT_OTHER): Payer: Self-pay | Admitting: *Deleted

## 2021-12-12 ENCOUNTER — Emergency Department (HOSPITAL_BASED_OUTPATIENT_CLINIC_OR_DEPARTMENT_OTHER): Payer: 59

## 2021-12-12 ENCOUNTER — Other Ambulatory Visit: Payer: Self-pay

## 2021-12-12 DIAGNOSIS — R197 Diarrhea, unspecified: Secondary | ICD-10-CM | POA: Insufficient documentation

## 2021-12-12 DIAGNOSIS — R110 Nausea: Secondary | ICD-10-CM | POA: Diagnosis not present

## 2021-12-12 DIAGNOSIS — R1084 Generalized abdominal pain: Secondary | ICD-10-CM

## 2021-12-12 DIAGNOSIS — R109 Unspecified abdominal pain: Secondary | ICD-10-CM | POA: Diagnosis present

## 2021-12-12 DIAGNOSIS — Z20822 Contact with and (suspected) exposure to covid-19: Secondary | ICD-10-CM | POA: Diagnosis not present

## 2021-12-12 LAB — COMPREHENSIVE METABOLIC PANEL
ALT: 15 U/L (ref 0–44)
AST: 22 U/L (ref 15–41)
Albumin: 3.9 g/dL (ref 3.5–5.0)
Alkaline Phosphatase: 39 U/L (ref 38–126)
Anion gap: 11 (ref 5–15)
BUN: 11 mg/dL (ref 6–20)
CO2: 22 mmol/L (ref 22–32)
Calcium: 8.7 mg/dL — ABNORMAL LOW (ref 8.9–10.3)
Chloride: 103 mmol/L (ref 98–111)
Creatinine, Ser: 0.82 mg/dL (ref 0.44–1.00)
GFR, Estimated: >60 mL/min (ref >60)
Glucose, Bld: 100 mg/dL — ABNORMAL HIGH (ref 70–99)
Potassium: 3.6 mmol/L (ref 3.5–5.1)
Sodium: 136 mmol/L (ref 135–145)
Total Bilirubin: 0.3 mg/dL (ref 0.3–1.2)
Total Protein: 7.3 g/dL (ref 6.5–8.1)

## 2021-12-12 LAB — URINALYSIS, ROUTINE W REFLEX MICROSCOPIC
Bilirubin Urine: NEGATIVE
Glucose, UA: NEGATIVE mg/dL
Ketones, ur: 15 mg/dL — AB
Nitrite: NEGATIVE
Protein, ur: 30 mg/dL — AB
Specific Gravity, Urine: 1.028 (ref 1.005–1.030)
pH: 6 (ref 5.0–8.0)

## 2021-12-12 LAB — RESP PANEL BY RT-PCR (FLU A&B, COVID) ARPGX2
Influenza A by PCR: NEGATIVE
Influenza B by PCR: NEGATIVE
SARS Coronavirus 2 by RT PCR: NEGATIVE

## 2021-12-12 LAB — CBC
HCT: 41.2 % (ref 36.0–46.0)
Hemoglobin: 13.7 g/dL (ref 12.0–15.0)
MCH: 28.8 pg (ref 26.0–34.0)
MCHC: 33.3 g/dL (ref 30.0–36.0)
MCV: 86.6 fL (ref 80.0–100.0)
Platelets: 235 10*3/uL (ref 150–400)
RBC: 4.76 MIL/uL (ref 3.87–5.11)
RDW: 11.9 % (ref 11.5–15.5)
WBC: 4.6 10*3/uL (ref 4.0–10.5)
nRBC: 0 % (ref 0.0–0.2)

## 2021-12-12 LAB — PREGNANCY, URINE: Preg Test, Ur: NEGATIVE

## 2021-12-12 LAB — LIPASE, BLOOD: Lipase: 33 U/L (ref 11–51)

## 2021-12-12 MED ORDER — OXYCODONE-ACETAMINOPHEN 5-325 MG PO TABS: 1.0000 | ORAL_TABLET | Freq: Four times a day (QID) | ORAL | 0 refills | Status: DC | PRN

## 2021-12-12 MED ORDER — LIDOCAINE 5 % EX PTCH: 1.0000 | MEDICATED_PATCH | CUTANEOUS | 0 refills | Status: DC

## 2021-12-12 MED ORDER — IOHEXOL 300 MG/ML  SOLN
80.0000 mL | Freq: Once | INTRAMUSCULAR | Status: AC | PRN
  Administered 2021-12-12: 80 mL via INTRAVENOUS
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
## 2021-12-12 NOTE — ED Provider Notes (Signed)
[facility] DEPT Provider Note   CSN: [id number] Arrival date & time: 12/12/21  1010     History  Chief Complaint  Patient presents with   Abdominal Pain    Emma Larsen is a 51 y.o. female.  Patient presents with chief complaint of episode of nausea and diarrhea and abdominal cramping.  Symptoms ongoing for the past 3 days.  Otherwise denies any fevers denies cough.      Home Medications Prior to Admission medications   Medication Sig Start Date End Date Taking? Authorizing Provider  ascorbic acid (VITAMIN C) 100 MG tablet Take by mouth.    [provider]  benzonatate (TESSALON) 100 MG capsule Take 1 capsule (100 mg total) by mouth every 8 (eight) hours. 09/15/21   Lacretia Leigh, MD  cetirizine (ZYRTEC) 10 MG tablet Take up to 2 tablets twice daily for hives 04/12/21   Valentina Shaggy, MD  EPINEPHrine (AUVI-Q) 0.3 mg/0.3 mL IJ SOAJ injection Use as directed for severe allergic reaction 04/12/21   Valentina Shaggy, MD  fexofenadine (ALLEGRA) 180 MG tablet TAKE 1 TABLET(180 MG) BY MOUTH DAILY 07/20/19   Valentina Shaggy, MD  fluticasone [facility]) 50 MCG/ACT nasal spray 2 sprays each nostril 1-2 times daily as needed 05/21/21   Valentina Shaggy, MD  Fluticasone Propionate [facility] Perna) 93 MCG/ACT EXHU Place 1 spray into the nose 2 (two) times daily. 04/12/21   Valentina Shaggy, MD  gabapentin (NEURONTIN) 300 MG capsule Take 1 capsule (300 mg total) by mouth 3 (three) times daily. 07/17/21 08/16/21  Valentina Shaggy, MD  Humidifiers (COOL MIST HUMIDIFIER 1.3 GAL) MISC U D PRN FOR CONGESTION 12/04/18   [provider]  ipratropium (ATROVENT) 0.03 % nasal spray Place 2 sprays into both nostrils 3 (three) times daily. 07/17/21   Valentina Shaggy, MD  Multiple Vitamins-Calcium (ONE-A-DAY WOMENS FORMULA) TABS TK 1 T PO QD 12/22/18   [provider]  NEOMYCIN-POLYMYXIN-HYDROCORTISONE (CORTISPORIN) 1 % SOLN OTIC solution  Apply 1-2 drops to toe BID after soaking 10/02/21   Evelina Bucy, DPM  neomycin-polymyxin-hydrocortisone (CORTISPORIN) OTIC solution Apply one to two drops to toe after soaking twice daily. 10/09/21   Hyatt, Max T, DPM  norethindrone-ethinyl estradiol (LOESTRIN) 1-20 MG-MCG tablet Take 1 tablet by mouth daily. 10/22/21   [provider]  NURTEC 75 MG TBDP Take by mouth. 10/17/21   [provider]  pantoprazole (PROTONIX) 20 MG tablet Take 1 tablet (20 mg total) by mouth 2 (two) times daily before a meal. 09/15/21   Lacretia Leigh, MD  pantoprazole (PROTONIX) 40 MG tablet Take 1 tablet (40 mg total) by mouth daily. 04/26/19   Valentina Shaggy, MD  Probiotic Product (DIGESTIVE ADVANTAGE GUMMIES) [place] QD UTD 12/01/18   [provider]  Respiratory Therapy Supplies (FLUTTER) DEVI Use as directed 06/03/18   Martyn Ehrich, NP  Spacer/Aero-Holding Chambers (AEROCHAMBER MV) inhaler Use as instructed 06/03/18   Martyn Ehrich, NP  sucralfate (CARAFATE) 1 g tablet Take 1 tablet (1 g total) by mouth 4 (four) times daily. 09/15/21   Lacretia Leigh, MD  SUMAtriptan (IMITREX) 25 MG tablet Take 25 mg by mouth every 2 (two) hours as needed for migraine. 06/07/18   [provider]  Ubrogepant 100 MG TABS Take by mouth.    [provider]  Vitamin D, Ergocalciferol, (DRISDOL) 1.25 MG (50000 UNIT) CAPS capsule Take 1 capsule by mouth once a week. 04/01/21   [provider]      Allergies    Sulfa antibiotics    Review of Systems   Review of Systems  Constitutional:  Negative for fever.  HENT:  Negative for ear pain.   Eyes:  Negative for pain.  Respiratory:  Negative for cough.   Cardiovascular:  Negative for chest pain.  Gastrointestinal:  Positive for abdominal pain and diarrhea.  Genitourinary:  Negative for flank pain.  Musculoskeletal:  Negative for back pain.  Skin:  Negative for rash.  Neurological:  Negative for  headaches.   Physical Exam Updated Vital Signs BP (!) 139/92    Pulse 96    Temp 97.7 F (36.5 C) (Oral)    Resp 14    Wt 66.7 kg    LMP 11/12/2021 (Approximate) Comment: irregular   SpO2 98%    BMI 27.78 kg/m  Physical Exam Constitutional:      General: She is not in acute distress.    Appearance: Normal appearance.  HENT:     Head: Normocephalic.     Nose: Nose normal.  Eyes:     Extraocular Movements: Extraocular movements intact.  Cardiovascular:     Rate and Rhythm: Normal rate.  Pulmonary:     Effort: Pulmonary effort is normal.  Abdominal:     Tenderness: There is abdominal tenderness.     Comments: Mild diffuse abdominal tenderness.  However no guarding or rebound.  Musculoskeletal:        General: Normal range of motion.     Cervical back: Normal range of motion.  Neurological:     General: No focal deficit present.     Mental Status: She is alert. Mental status is at baseline.    ED Results / Procedures / Treatments   Labs (all labs ordered are listed, but only abnormal results are displayed) Labs Reviewed  COMPREHENSIVE METABOLIC PANEL - Abnormal; Notable for the following components:      Result Value   Glucose, Bld 100 (*)    Calcium 8.7 (*)    All other components within normal limits  URINALYSIS, ROUTINE W REFLEX MICROSCOPIC - Abnormal; Notable for the following components:   Hgb urine dipstick MODERATE (*)    Ketones, ur 15 (*)    Protein, ur 30 (*)    Leukocytes,Ua TRACE (*)    Bacteria, UA RARE (*)    All other components within normal limits  RESP PANEL BY RT-PCR (FLU A&B, COVID) ARPGX2  LIPASE, BLOOD  PREGNANCY, URINE  CBC    EKG None  Radiology CT ABDOMEN PELVIS W CONTRAST  Result Date: 12/12/2021 CLINICAL DATA:  Abdominal pain EXAM: CT ABDOMEN AND PELVIS WITH CONTRAST TECHNIQUE: Multidetector CT imaging of the abdomen and pelvis was performed using the standard protocol following bolus administration of intravenous contrast. RADIATION  DOSE REDUCTION: This exam was performed according to the departmental dose-optimization program which includes automated exposure control, adjustment of the mA and/or kV according to patient size and/or use of iterative reconstruction technique. CONTRAST:  80mL OMNIPAQUE IOHEXOL 300 MG/ML  SOLN COMPARISON:  None. FINDINGS: Lower chest: Unremarkable. Hepatobiliary: No significant abnormality is seen in the gallbladder. There are few small low-density foci in the liver each measuring less than 7 mm. There is no dilation of bile ducts. Pancreas: No focal abnormality is seen. Spleen: Unremarkable Adrenals/Urinary Tract: Adrenals are not enlarged. There is no hydronephrosis. There are few small stones in the mid and lower portions of left kidney each measuring less than 3 mm. There is  7 mm calculus in the lower pole of right kidney. There is ill-defined focus of low attenuation in the anterior upper pole of right kidney. Ureters are not dilated. Urinary bladder is not distended. Stomach/Bowel: Stomach is unremarkable. Small bowel loops are not dilated. Appendix is not dilated. There is mild mucosal enhancement in the right colon. There is no significant focal wall thickening in the colon. There is no pericolic stranding or fluid collection. Vascular/Lymphatic: Vascular structures are unremarkable. There are subcentimeter lymph nodes in the retroperitoneum and mesentery. Reproductive: Unremarkable. Other: There is no ascites or pneumoperitoneum. Small umbilical hernia containing fat is seen. Musculoskeletal: Unremarkable. IMPRESSION: There is no evidence of intestinal obstruction or pneumoperitoneum. Appendix is not dilated. There is no hydronephrosis. Bilateral renal stones. There is an ill-defined area of decreased density in the anterior aspect of upper pole of right kidney which may suggest underlying renal cyst or infectious process such as pyelonephritis. Please correlate with laboratory findings. There are few  subcentimeter low-density foci in the liver suggesting benign process such as cysts or hemangiomas. Less likely possibility would be neoplastic process. Comparison previous studies or follow-up CT in 3 months may be considered. There is mild mucosal enhancement in the ascending colon which may be a normal variation or suggest nonspecific colitis. Electronically Signed   By: Elmer Picker M.D.   On: 12/12/2021 12:50    Procedures Procedures    Medications Ordered in ED Medications  iohexol (OMNIPAQUE) 300 MG/ML solution 80 mL (80 mLs Intravenous Contrast Given 12/12/21 1225)    ED Course/ Medical Decision Making/ A&P                           Medical Decision Making Amount and/or Complexity of Data Reviewed Labs: ordered. Radiology: ordered.  Risk Prescription drug management.   Review of records show recent visit with the allergy specialist on December 07, 2021 for immunotherapy.  Labs today unremarkable.  White count normal chemistry is normal.  CT abdomen pelvis shows no acute findings but there were some nodules seen in the liver.  Patient recommended outpatient follow-up with her doctor within the next 2 to 3 weeks for repeat imaging and follow-up.  Advised immediate return for recurrent pain fevers or any additional concerns.        Final Clinical Impression(s) / ED Diagnoses Final diagnoses:  Generalized abdominal pain    Rx / DC Orders ED Discharge Orders          Ordered    lidocaine (LIDODERM) 5 %  Every 24 hours,   Status:  Discontinued        12/12/21 1206    oxyCODONE-acetaminophen (PERCOCET/ROXICET) 5-325 MG tablet  Every 6 hours PRN,   Status:  Discontinued        12/12/21 [place], [place] Nol[place] S, MD 12/12/21 1306

## 2021-12-12 NOTE — Discharge Instructions (Addendum)
Call your primary care doctor or specialist as discussed in the next 2-3 days.   Return immediately back to the ER if:  Your symptoms worsen within the next 12-24 hours. You develop new symptoms such as new fevers, persistent vomiting, new pain, shortness of breath, or new weakness or numbness, or if you have any other concerns.  

## 2022-01-03 ENCOUNTER — Other Ambulatory Visit: Payer: Self-pay

## 2022-01-03 ENCOUNTER — Ambulatory Visit (INDEPENDENT_AMBULATORY_CARE_PROVIDER_SITE_OTHER): Payer: 59 | Admitting: *Deleted

## 2022-01-03 DIAGNOSIS — L501 Idiopathic urticaria: Secondary | ICD-10-CM | POA: Diagnosis not present

## 2022-01-09 ENCOUNTER — Other Ambulatory Visit: Payer: Self-pay | Admitting: Allergy & Immunology

## 2022-01-17 ENCOUNTER — Ambulatory Visit: Payer: 59 | Admitting: Allergy & Immunology

## 2022-01-24 ENCOUNTER — Other Ambulatory Visit: Payer: Self-pay | Admitting: Allergy & Immunology

## 2022-01-31 ENCOUNTER — Ambulatory Visit: Payer: 59

## 2022-02-01 ENCOUNTER — Other Ambulatory Visit: Payer: Self-pay

## 2022-02-01 ENCOUNTER — Ambulatory Visit (INDEPENDENT_AMBULATORY_CARE_PROVIDER_SITE_OTHER): Payer: 59

## 2022-02-01 ENCOUNTER — Ambulatory Visit: Payer: Self-pay

## 2022-02-01 DIAGNOSIS — L501 Idiopathic urticaria: Secondary | ICD-10-CM | POA: Diagnosis not present

## 2022-02-13 ENCOUNTER — Other Ambulatory Visit: Payer: Self-pay | Admitting: Allergy & Immunology

## 2022-02-14 ENCOUNTER — Ambulatory Visit: Payer: 59 | Admitting: Allergy & Immunology

## 2022-02-14 ENCOUNTER — Encounter: Payer: Self-pay | Admitting: Allergy & Immunology

## 2022-02-14 VITALS — BP 126/84 | HR 85 | Temp 97.9°F | Resp 16 | Ht 61.0 in | Wt 145.0 lb

## 2022-02-14 DIAGNOSIS — J329 Chronic sinusitis, unspecified: Secondary | ICD-10-CM

## 2022-02-14 DIAGNOSIS — L501 Idiopathic urticaria: Secondary | ICD-10-CM

## 2022-02-14 DIAGNOSIS — J302 Other seasonal allergic rhinitis: Secondary | ICD-10-CM

## 2022-02-14 DIAGNOSIS — J3089 Other allergic rhinitis: Secondary | ICD-10-CM

## 2022-02-14 DIAGNOSIS — H66002 Acute suppurative otitis media without spontaneous rupture of ear drum, left ear: Secondary | ICD-10-CM

## 2022-02-14 MED ORDER — DOXYCYCLINE MONOHYDRATE 100 MG PO CAPS: 100.0000 mg | ORAL_CAPSULE | Freq: Two times a day (BID) | ORAL | 0 refills | Status: AC

## 2022-02-14 MED ORDER — IPRATROPIUM BROMIDE 0.06 % NA SOLN: 2.0000 | Freq: Four times a day (QID) | NASAL | 12 refills | Status: DC

## 2022-02-14 MED ORDER — CARBINOXAMINE MALEATE 4 MG PO TABS: 4.0000 mg | ORAL_TABLET | Freq: Two times a day (BID) | ORAL | 5 refills | Status: DC | PRN

## 2022-02-14 NOTE — Progress Notes (Signed)
? ?FOLLOW UP ? ?Date of Service/Encounter:  02/14/22 ? ? ?Assessment:  ? ?Seasonal and perennial allergic rhinitis (trees, weeds, grasses, indoor molds, dust mites, cat, dog and cockroach) - was briefly on allergen immunotherapy  ?  ?Adverse food reaction (seafood) - with negative skin testing to seafood and now tolerates ?  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  ?New onset chronic urticaria  ? ?Plan/Recommendations:  ? ?1. Seasonal and perennial allergic rhinitis ?- We are increasing the Atrovent to the higher strength (use this 2-3 times daily EVERY DAY).  ?- Add on carbinoxamine 4mg  twice daily to help dry up the postnasal drip. ? ?2. Cough ?- Continue to follow with your GI doctor.  ?- Stop the gabapentin. ?- Maybe drying out with the carbinoxamine will help. ?- Let know the results.  ? ?3. Chronic autoimmune urticaria  ?- Continue with Xolair monthly as you are doing.  ? ?4. Left acute otitis media ?- Start doxycycline 100mg  twice daily for one week. ? ?5. Return in about 6 months (around 08/16/2022).  ? ? ?Subjective:  ? ?Emma Larsen is a 51 y.o. female presenting today for follow up of  ?Chief Complaint  ?Patient presents with  ? Seasonal and Perennial Allergic rhinitis  ?  6 mth f/u - Horrible  ? ? ?Emma Larsen has a history of the following: ?Patient Active Problem List  ? Diagnosis Date Noted  ? Constipation 12/02/2019  ? Allergy to environmental factors 12/02/2019  ? Ear itching 11/09/2019  ? Globus sensation 11/09/2019  ? Schatzki's ring 10/19/2019  ? Chronic cough 09/08/2019  ? Dysphonia 09/08/2019  ? Laryngospasm 09/08/2019  ? Sinusitis, acute 07/08/2018  ? Simple chronic bronchitis (HCC) 06/03/2018  ? Seasonal and perennial allergic rhinitis 02/19/2018  ? SOB (shortness of breath) 02/16/2018  ? Adverse food reaction 02/16/2018  ? Chronic rhinitis 02/16/2018  ? Gastroesophageal reflux disease 02/16/2018  ? ? ?History obtained from: chart review and patient. ? ?Emma Larsen is a 51  y.o. female presenting for a follow up visit.  We last saw her in September 2022.  At that time, we continue with her antihistamines including Zyrtec and famotidine to help with her allergic rhinitis and chronic urticaria.  We also continue with Xhance 1 spray per nostril twice daily.  We stopped her Flonase.  We added on ipratropium to see if this will help with decreasing her postnasal drip, as she asserts that the postnasal drip has more to do with her cough than anything else.  We did order a sinus CT.  We added gabapentin at night to help with presumed neurogenic cough.  She was previously followed with Dr. 44, who diagnosed her with neurogenic cough. ? ?We did get the sinus CT. this demonstrated a congested appearance of the nasal mucosa with a concha bullosa narrowing the bilateral middle meatus. ? ?Since the last visit, she has largely done well. She continues to have a lot of cough that is productive.  She does think that decreasing her postnasal drip will help a lot with the symptoms.  She has seen ENT in the past, although I am unsure of her recent follow ups since Dr. October 2022 does send notes regularly.  ? ?She does not see GI regularly. She had Heliobacter pylori and it was treated. She has not seen GI again since then. She has constant drainage.  ? ?She on Xhance but this did not work at all. She put her on Xolair which was  more for her chronic urticaria, but I thought that it might help with her sinus disease as well given its newer indications of nasal polyposis. It is not clear that this has happened, but her hives are certainly resolved.  ? ?She does not think that the Xolair did much for her sinus issues. She has not had relief from her postnasal drip.  She continues to think that this is what causes a lot of her coughing.  She has tried Atrovent but this has not helped.  [place] been [place].  She has not been on carbinoxamine. ? ?Otherwise, there have been no changes to her past medical history,  surgical history, family history, or social history. ? ? ? ?Review of Systems  ?Constitutional: Negative.  Negative for chills, fever, malaise/fatigue and weight loss.  ?HENT: Negative.  Negative for congestion, ear discharge, ear pain and sinus pain.   ?Eyes:  Negative for pain, discharge and redness.  ?Respiratory:  Negative for cough, sputum production, shortness of breath and wheezing.   ?Cardiovascular: Negative.  Negative for chest pain and palpitations.  ?Gastrointestinal:  Negative for abdominal pain, constipation, diarrhea, heartburn, nausea and vomiting.  ?Skin: Negative.  Negative for itching and rash.  ?Neurological:  Negative for dizziness and headaches.  ?Endo/Heme/Allergies:  Negative for environmental allergies. Does not bruise/bleed easily.   ? ? ? ?Objective:  ? ?Blood pressure 126/84, pulse 85, temperature 97.9 ?F (36.6 ?C), resp. rate 16, height 5\' 1"  (1.549 m), weight 145 lb (65.8 kg), SpO2 98 %. ?Body mass index is 27.4 kg/m?. ? ? ? ?Physical Exam ?Vitals reviewed.  ?Constitutional:   ?   Appearance: She is well-developed.  ?   Comments: Talkative.  ?HENT:  ?   Head: Normocephalic and atraumatic.  ?   Right Ear: Tympanic membrane, ear canal and external ear normal.  ?   Left Ear: Ear canal and external ear normal. A middle ear effusion is present.  ?   Nose: No nasal deformity, septal deviation, mucosal edema or rhinorrhea.  ?   Right Turbinates: Enlarged and swollen.  ?   Left Turbinates: Enlarged and swollen.  ?   Right Sinus: Maxillary sinus tenderness and frontal sinus tenderness present.  ?   Left Sinus: Maxillary sinus tenderness and frontal sinus tenderness present.  ?   Mouth/Throat:  ?   Mouth: Mucous membranes are not pale and not dry.  ?   Pharynx: Uvula midline.  ?Eyes:  ?   General: Lids are normal. No allergic shiner.    ?   Right eye: No discharge.     ?   Left eye: No discharge.  ?   Conjunctiva/sclera: Conjunctivae normal.  ?   Right eye: Right conjunctiva is not injected. No  chemosis. ?   Left eye: Left conjunctiva is not injected. No chemosis. ?   Pupils: Pupils are equal, round, and reactive to light.  ?Cardiovascular:  ?   Rate and Rhythm: Normal rate and regular rhythm.  ?   Heart sounds: Normal heart sounds.  ?Pulmonary:  ?   Effort: Pulmonary effort is normal. No tachypnea, accessory muscle usage or respiratory distress.  ?   Breath sounds: Normal breath sounds. No wheezing, rhonchi or rales.  ?   Comments: Moving air well in all lung fields. No increased work of breathing noted.  ?Chest:  ?   Chest wall: No tenderness.  ?Lymphadenopathy:  ?   Cervical: No cervical adenopathy.  ?Skin: ?   General: Skin is  warm.  ?   Capillary Refill: Capillary refill takes less than 2 seconds.  ?   Coloration: Skin is not pale.  ?   Findings: No abrasion, erythema, petechiae or rash. Rash is not papular, urticarial or vesicular.  ?   Comments: No urticaria noted today.   ?Neurological:  ?   Mental Status: She is alert.  ?Psychiatric:     ?   Behavior: Behavior is cooperative.  ?  ? ?Diagnostic studies: none ? ? ? ?  ?Malachi Bonds, MD  ?[facility] ? ? ? ? ? ? ?

## 2022-02-14 NOTE — Patient Instructions (Addendum)
1. Seasonal and perennial allergic rhinitis ?- We are increasing the Atrovent to the higher strength (use this 2-3 times daily EVERY DAY).  ?- Add on carbinoxamine 4mg  twice daily to help dry up the postnasal drip. ? ?2. Cough ?- Continue to follow with your GI doctor.  ?- Stop the gabapentin. ?- Maybe drying out with the carbinoxamine will help. ?- Let know the results.  ? ?3. Chronic autoimmune urticaria  ?- Continue with Xolair monthly as you are doing.  ? ?4. Left acute otitis media ?- Start doxycycline 100mg  twice daily for one week. ? ?5. Return in about 6 months (around 08/16/2022).  ? ? ?Please inform of any Emergency Department visits, hospitalizations, or changes in symptoms. Call 10/16/2022 before going to the ED for breathing or allergy symptoms since we might be able to fit you in for a sick visit. Feel free to contact us anytime with any questions, problems, or concerns. ? ?It was a pleasure to see you today! ? ?Websites that have reliable patient information: ?1. American Academy of Asthma, Allergy, and Immunology: www.aaaai.org ?2. Food Allergy Research and Education (FARE): foodallergy.org ?3. Mothers of Asthmatics: http://www.asthmacommunitynetwork.org ?4. [place] of Allergy, Asthma, and Immunology: [place] ? ? ?COVID-19 Vaccine Information can be found at: [employer] For questions related to vaccine distribution or appointments, please email [email or web address] or call [phone].  ? ?We realize that you might be concerned about having an allergic reaction to the COVID19 vaccines. To help with that concern, WE ARE OFFERING THE COVID19 VACCINES IN OUR OFFICE! Ask the front desk for dates!  ? ? ? ??Like? [email or web address] on Facebook and Instagram for our latest updates!  ?  ? ? ?A healthy democracy works best when [phone] participate! Make sure you are registered to vote! If you have moved or changed any of your contact  information, you will need to get this updated before voting! ? ?In some cases, you MAY be able to register to vote online: [place] ? ? ? ? ? ? ? ? ?

## 2022-02-19 ENCOUNTER — Encounter: Payer: Self-pay | Admitting: Allergy & Immunology

## 2022-03-01 ENCOUNTER — Ambulatory Visit (INDEPENDENT_AMBULATORY_CARE_PROVIDER_SITE_OTHER): Payer: 59 | Admitting: *Deleted

## 2022-03-01 DIAGNOSIS — L501 Idiopathic urticaria: Secondary | ICD-10-CM

## 2022-03-29 ENCOUNTER — Ambulatory Visit: Payer: 59

## 2022-04-01 ENCOUNTER — Ambulatory Visit: Payer: 59

## 2022-04-05 ENCOUNTER — Ambulatory Visit (INDEPENDENT_AMBULATORY_CARE_PROVIDER_SITE_OTHER): Payer: 59

## 2022-04-05 DIAGNOSIS — L501 Idiopathic urticaria: Secondary | ICD-10-CM

## 2022-04-15 ENCOUNTER — Telehealth: Payer: Self-pay

## 2022-04-15 MED ORDER — OMALIZUMAB 150 MG/ML ~~LOC~~ SOSY: 300.0000 mg | PREFILLED_SYRINGE | SUBCUTANEOUS | 11 refills | Status: DC

## 2022-04-15 NOTE — Addendum Note (Signed)
Addended by: Devoria Glassing on: 04/15/2022 09:39 AM   Modules accepted: Orders

## 2022-04-15 NOTE — Telephone Encounter (Signed)
Patient called into the office to let us know that the specialty pharmacy for Xolair called her to let her know that she needs a new prescription sent in for her Xolair. She has shot scheduled for 05/03/22.

## 2022-04-15 NOTE — Telephone Encounter (Signed)
Thank you!   Rashad Obeid, MD [facility]   

## 2022-04-15 NOTE — Telephone Encounter (Signed)
Rx sent to [facility]

## 2022-05-03 ENCOUNTER — Ambulatory Visit (INDEPENDENT_AMBULATORY_CARE_PROVIDER_SITE_OTHER): Payer: 59

## 2022-05-03 DIAGNOSIS — L501 Idiopathic urticaria: Secondary | ICD-10-CM | POA: Diagnosis not present

## 2022-05-31 ENCOUNTER — Ambulatory Visit (INDEPENDENT_AMBULATORY_CARE_PROVIDER_SITE_OTHER): Payer: 59 | Admitting: *Deleted

## 2022-05-31 DIAGNOSIS — L501 Idiopathic urticaria: Secondary | ICD-10-CM | POA: Diagnosis not present

## 2022-06-28 ENCOUNTER — Ambulatory Visit: Payer: 59

## 2022-07-04 ENCOUNTER — Ambulatory Visit (INDEPENDENT_AMBULATORY_CARE_PROVIDER_SITE_OTHER): Payer: 59

## 2022-07-04 DIAGNOSIS — L501 Idiopathic urticaria: Secondary | ICD-10-CM | POA: Diagnosis not present

## 2022-07-26 ENCOUNTER — Encounter: Payer: Self-pay | Admitting: *Deleted

## 2022-07-31 ENCOUNTER — Ambulatory Visit (INDEPENDENT_AMBULATORY_CARE_PROVIDER_SITE_OTHER): Payer: 59 | Admitting: *Deleted

## 2022-07-31 DIAGNOSIS — L501 Idiopathic urticaria: Secondary | ICD-10-CM | POA: Diagnosis not present

## 2022-08-20 ENCOUNTER — Ambulatory Visit: Payer: 59 | Admitting: Allergy & Immunology

## 2022-08-20 DIAGNOSIS — J309 Allergic rhinitis, unspecified: Secondary | ICD-10-CM

## 2022-08-22 ENCOUNTER — Ambulatory Visit: Payer: 59 | Admitting: Allergy & Immunology

## 2022-08-22 ENCOUNTER — Telehealth: Payer: Self-pay

## 2022-08-22 ENCOUNTER — Encounter: Payer: Self-pay | Admitting: Allergy & Immunology

## 2022-08-22 ENCOUNTER — Other Ambulatory Visit: Payer: Self-pay

## 2022-08-22 VITALS — BP 128/86 | HR 96 | Temp 98.0°F | Resp 16 | Ht 60.63 in | Wt 151.6 lb

## 2022-08-22 DIAGNOSIS — J3089 Other allergic rhinitis: Secondary | ICD-10-CM

## 2022-08-22 DIAGNOSIS — J302 Other seasonal allergic rhinitis: Secondary | ICD-10-CM

## 2022-08-22 DIAGNOSIS — L501 Idiopathic urticaria: Secondary | ICD-10-CM | POA: Diagnosis not present

## 2022-08-22 DIAGNOSIS — J329 Chronic sinusitis, unspecified: Secondary | ICD-10-CM

## 2022-08-22 DIAGNOSIS — R053 Chronic cough: Secondary | ICD-10-CM

## 2022-08-22 MED ORDER — XHANCE 93 MCG/ACT NA EXHU: INHALANT_SUSPENSION | NASAL | 5 refills | Status: DC

## 2022-08-22 MED ORDER — OMEPRAZOLE 40 MG PO CPDR: 40.0000 mg | DELAYED_RELEASE_CAPSULE | Freq: Every day | ORAL | 5 refills | Status: DC

## 2022-08-22 MED ORDER — FAMOTIDINE 20 MG PO TABS: 20.0000 mg | ORAL_TABLET | Freq: Every day | ORAL | 5 refills | Status: DC

## 2022-08-22 NOTE — Telephone Encounter (Signed)
Pa for xhance submitted to insurances thru cover my meds waiting on response from insurnace key [id number]

## 2022-08-22 NOTE — Patient Instructions (Addendum)
1. Seasonal and perennial allergic rhinitis - We are going to try to simplify the regimen.  - Continue with Xhance one spray per nostril daily.  - Put all of your other nose sprays aside.   2. Chronic autoimmune urticaria  - Continue with Xolair monthly as you are doing.   3. GERD - Start taking omeprazole 40mg  once daily. - Continue with famotidine 40mg  once daily.  Gastroenterology:  Eduardo Osier, PA-C   [address]   [place]   [place], [place] [address]    4. Return in about 3 months (around 11/22/2022).    Please inform us of any Emergency Department visits, hospitalizations, or changes in symptoms. Call us before going to the ED for breathing or allergy symptoms since we might be able to fit you in for a sick visit. Feel free to contact us anytime with any questions, problems, or concerns.  It was a pleasure to see you today!  Websites that have reliable patient information: 1. American Academy of Asthma, Allergy, and Immunology: www.aaaai.org 2. Food Allergy Research and Education (FARE): foodallergy.org 3. Mothers of Asthmatics: http://www.asthmacommunitynetwork.org 4. American College of Allergy, Asthma, and Immunology: www.acaai.org   COVID-19 Vaccine Information can be found at: [employer]Sc[employer]co.uk For questions related to vaccine distribution or appointments, please email [email or web address] or call [phone].   We realize that you might be concerned about having an allergic reaction to the COVID19 vaccines. To help with that concern, WE ARE OFFERING THE COVID19 VACCINES IN OUR OFFICE! Ask the front desk for dates!     "Like" [place] on Facebook and Instagram for our latest updates!      A healthy democracy works best when [employer] participate! Make sure you are registered to vote! If you have moved or changed any of your contact information, you will need to get this updated before  voting!  In some cases, you MAY be able to register to vote online: [email or web address]

## 2022-08-22 NOTE — Progress Notes (Signed)
FOLLOW UP  Date of Service/Encounter:  08/22/22   Assessment:   Seasonal and perennial allergic rhinitis (trees, weeds, grasses, indoor molds, dust mites, cat, dog and cockroach) - was briefly on allergen immunotherapy    Adverse food reaction (seafood) - with negative skin testing to seafood and now tolerates   Shortness of breath - diagnosed as neurogenic cough   Chronic urticaria - well controlled with Xolair monthly  Plan/Recommendations:   1. Seasonal and perennial allergic rhinitis - We are going to try to simplify the regimen.  - Continue with Xhance one spray per nostril daily.  - Put all of your other nose sprays aside.   2. Chronic autoimmune urticaria  - Continue with Xolair monthly as you are doing.   3. GERD - Start taking omeprazole 40mg  once daily. - Continue with famotidine 40mg  once daily.  Gastroenterology:  , PA-C   [address]   [address]   [place], [place]    4. Return in about 3 months (around 11/22/2022).    Subjective:   Emma Larsen is a 51 y.o. female presenting today for follow up of  Chief Complaint  Patient presents with   Allergic Rhinitis     Has been having issues with vomiting and coughing up flem - her food gets stuck in her throat causing her to vomit after eating     Emma Larsen has a history of the following: Patient Active Problem List   Diagnosis Date Noted   Impacted cerumen of both ears 04/30/2021   Tendonitis of wrist, right 03/07/2021   Constipation 12/02/2019   Allergy to environmental factors 12/02/2019   Ear itching 11/09/2019   Globus sensation 11/09/2019   Schatzki's ring 10/19/2019   Chronic cough 09/08/2019   Dysphonia 09/08/2019   Laryngospasm 09/08/2019   Sinusitis, acute 07/08/2018   Simple chronic bronchitis (HCC) 06/03/2018   Seasonal and perennial allergic rhinitis 02/19/2018   SOB (shortness of breath) 02/16/2018   Adverse food reaction 02/16/2018    Chronic rhinitis 02/16/2018   Gastroesophageal reflux disease 02/16/2018    History obtained from: chart review and patient.  Jirah is a 51 y.o. female presenting for a follow up visit.  She was last seen in April 2023.  At that time, we increased her Atrovent to the higher strength to use 2-3 times every day.  We added on carbinoxamine to help with anticholinergic activity to decrease secretions.  For her cough, she continues to follow with GI.  We stopped her gabapentin since this did not seem to make a difference.  For her autoimmune urticaria, we continued with Xolair monthly.  We diagnosed her with a left acute otitis media and started doxycycline twice daily for 1 week.  Since last visit, she has largely done well.  She is going through a divorce right now, so the last several months have been an up-and-down roller coaster.  She has now made peace with everything and is actually happy to be leaving.   Asthma/Respiratory Symptom History: Her cough has remained about the same.  She has a lot of postnasal drip and mucus in her throat.  She has not seen Dr. 44 in quite some time.  She has been on asthma medications in the past, but they have never been very useful.  Allergic Rhinitis Symptom History: She has noticed that the phlegm has become an issue. She is using the Flonase, but she really likes the [place] a lot more.  The Timmothy Sours has allowed her to breathe a little more easily.  She has some coughing this week. She has red eyes nearly every morning.  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  She has seen Dr. Suszanne Conners, although at the time that was for an evaluation of chronic cough.  This is when she was diagnosed with neurogenic cough.  She has never had any sinus surgery.  Sinus CT (September 2022)   Skin Symptom History: She is doing her Xolair and this is working well. She notices a huge improvement.   She has not had any breakouts at all.  Xolair injections are going well without any adverse event.  GERD Symptom History: She has been having a lot of silent reflux. She reports worsening symptoms with coffee and grits. She has some swallowing issues. She does not take anything consistent. She did have an endoscopy performed in [place]. She does not remember his name. She is going to reschedule her appointment with gastroenterology.  It seems that she has not been taking a lot of time to take care of herself.  Both of her children have gone on college trips and she has paid for that.  She is going to be going on some girls trips.  She is excited about that.  Otherwise, there have been no changes to her past medical history, surgical history, family history, or social history.    Review of Systems  Constitutional: Negative.  Negative for chills, fever, malaise/fatigue and weight loss.  HENT:  Positive for congestion. Negative for ear discharge and ear pain.        Positive for postnasal drip.  Positive for productive cough.  Eyes:  Negative for pain, discharge and redness.  Respiratory:  Negative for cough, sputum production, shortness of breath and wheezing.   Cardiovascular: Negative.  Negative for chest pain and palpitations.  Gastrointestinal:  Negative for abdominal pain, constipation, diarrhea, heartburn, nausea and vomiting.  Skin: Negative.  Negative for itching and rash.  Neurological:  Negative for dizziness and headaches.  Endo/Heme/Allergies:  Positive for environmental allergies. Does not bruise/bleed easily.       Objective:   Blood pressure 128/86, pulse 96, temperature 98 F (36.7 C), resp. rate 16, height 5' 0.63" (1.54 m), weight 151 lb 9.6 oz (68.8 kg), SpO2 99 %. Body mass index is 29 kg/m.    Physical Exam Vitals reviewed.  Constitutional:      Appearance: She is well-developed.     Comments: Talkative.  HENT:     Head: Normocephalic and atraumatic.      Right Ear: Tympanic membrane, ear canal and external ear normal.     Left Ear: Tympanic membrane, ear canal and external ear normal.     Nose: No nasal deformity, septal deviation, mucosal edema or rhinorrhea.     Right Turbinates: Enlarged and swollen.     Left Turbinates: Enlarged and swollen.     Right Sinus: No maxillary sinus tenderness or frontal sinus tenderness.     Left Sinus: No maxillary sinus tenderness or frontal sinus tenderness.     Mouth/Throat:     Mouth: Mucous membranes are not pale and not dry.     Pharynx: Uvula midline.     Comments: Cobblestoning in the posterior oropharynx. Eyes:     General: Lids are normal. No allergic shiner.       Right eye: No discharge.  Left eye: No discharge.     Conjunctiva/sclera: Conjunctivae normal.     Right eye: Right conjunctiva is not injected. No chemosis.    Left eye: Left conjunctiva is not injected. No chemosis.    Pupils: Pupils are equal, round, and reactive to light.  Cardiovascular:     Rate and Rhythm: Normal rate and regular rhythm.     Heart sounds: Normal heart sounds.  Pulmonary:     Effort: Pulmonary effort is normal. No tachypnea, accessory muscle usage or respiratory distress.     Breath sounds: Normal breath sounds. No wheezing, rhonchi or rales.     Comments: Moving air well in all lung fields. No increased work of breathing noted.  Chest:     Chest wall: No tenderness.  Lymphadenopathy:     Cervical: No cervical adenopathy.  Skin:    General: Skin is warm.     Capillary Refill: Capillary refill takes less than 2 seconds.     Coloration: Skin is not pale.     Findings: No abrasion, erythema, petechiae or rash. Rash is not papular, urticarial or vesicular.     Comments: No urticaria noted today.   Neurological:     Mental Status: She is alert.  Psychiatric:        Behavior: Behavior is cooperative.      Diagnostic studies: none       Salvatore Marvel, MD  [facility] [place]

## 2022-08-28 ENCOUNTER — Ambulatory Visit: Payer: 59

## 2022-08-29 ENCOUNTER — Ambulatory Visit: Payer: 59

## 2022-08-30 ENCOUNTER — Ambulatory Visit (INDEPENDENT_AMBULATORY_CARE_PROVIDER_SITE_OTHER): Payer: 59

## 2022-08-30 DIAGNOSIS — L501 Idiopathic urticaria: Secondary | ICD-10-CM | POA: Diagnosis not present

## 2022-09-04 ENCOUNTER — Telehealth: Payer: Self-pay | Admitting: Allergy & Immunology

## 2022-09-04 NOTE — Telephone Encounter (Signed)
Latarsha states she spoke with Dr. Ernst Bowler the last time she was here getting Xolair and he was going to call in eye drops and she never received them.  Dejane would like eye drops called in to [facility] on [address].

## 2022-09-05 MED ORDER — EPINASTINE HCL 0.05 % OP SOLN: 1.0000 [drp] | Freq: Two times a day (BID) | OPHTHALMIC | 5 refills | Status: AC

## 2022-09-05 NOTE — Telephone Encounter (Signed)
I sent some in. Please let the patient know.  Salvatore Marvel, MD [facility] [place]

## 2022-09-05 NOTE — Telephone Encounter (Signed)
I called and left a detailed message for the patient in regards to the eye drops being sent in by Dr. Ernst Bowler. I left our [place] office number for her to call back with any questions or concerns.

## 2022-09-26 ENCOUNTER — Telehealth: Payer: Self-pay

## 2022-09-26 NOTE — Telephone Encounter (Signed)
PA request received via CMM and submitted to [facility].  PA is awaiting determination.  KeyDeland Pretty - PA Case ID: [id number]

## 2022-09-27 ENCOUNTER — Ambulatory Visit: Payer: 59

## 2022-09-30 ENCOUNTER — Ambulatory Visit: Payer: 59

## 2022-09-30 NOTE — Telephone Encounter (Signed)
Which medication is this for?

## 2022-09-30 NOTE — Telephone Encounter (Signed)
Emma Larsen

## 2022-09-30 NOTE — Telephone Encounter (Signed)
PA has been DENIED due to not meeting the clinical requirements and the medication is excluded from coverage.

## 2022-09-30 NOTE — Telephone Encounter (Signed)
XHANCE pa was denied please advise to change in therapy

## 2022-10-01 ENCOUNTER — Ambulatory Visit (INDEPENDENT_AMBULATORY_CARE_PROVIDER_SITE_OTHER): Payer: 59

## 2022-10-01 DIAGNOSIS — Z538 Procedure and treatment not carried out for other reasons: Secondary | ICD-10-CM

## 2022-10-01 NOTE — Telephone Encounter (Signed)
How about generic Dymista two sprays per nostril BID?   Malachi Bonds, MD [facility]

## 2022-10-01 NOTE — Telephone Encounter (Signed)
Brand and generic is not covered

## 2022-10-02 ENCOUNTER — Telehealth: Payer: Self-pay | Admitting: Allergy & Immunology

## 2022-10-02 ENCOUNTER — Ambulatory Visit (INDEPENDENT_AMBULATORY_CARE_PROVIDER_SITE_OTHER): Payer: 59 | Admitting: *Deleted

## 2022-10-02 DIAGNOSIS — L501 Idiopathic urticaria: Secondary | ICD-10-CM | POA: Diagnosis not present

## 2022-10-02 NOTE — Telephone Encounter (Signed)
Patient stated her primary is refilling this medication, so please disregard.

## 2022-10-02 NOTE — Telephone Encounter (Signed)
Patient stated that she needs a refill on her migraine medication Nurtec 75mg . Patient stated she is out of this medication and needs it as soon as possible as she is suffering from migraine pain now. Patients pharmacy is [facility] on [address]. Patients call back number is [phone].

## 2022-10-08 NOTE — Telephone Encounter (Signed)
She has been on both fluticaone AND azelastine. Can we not  fill out a PA based on these past attempts?  Malachi Bonds, MD [facility]

## 2022-10-08 NOTE — Telephone Encounter (Signed)
Past attempts of fluticasone and the dymista were noted as past attempts and failures.

## 2022-10-09 ENCOUNTER — Other Ambulatory Visit: Payer: Self-pay | Admitting: *Deleted

## 2022-10-09 NOTE — Telephone Encounter (Signed)
Yes let's do that. Thanks

## 2022-10-09 NOTE — Telephone Encounter (Signed)
Do you want me to send in Dymista and then do a PA for the medication?

## 2022-10-10 NOTE — Telephone Encounter (Signed)
Dysmita is not on formulary but individually azelastine 1% and flonase are is it okay to separate?

## 2022-10-11 MED ORDER — FLUTICASONE PROPIONATE 50 MCG/ACT NA SUSP: 2.0000 | Freq: Every day | NASAL | 5 refills | Status: DC

## 2022-10-11 MED ORDER — AZELASTINE HCL 0.1 % NA SOLN
2.0000 | Freq: Two times a day (BID) | NASAL | 5 refills | Status: DC
Start: 2022-10-11 — End: 2024-03-08

## 2022-10-11 NOTE — Telephone Encounter (Signed)
Sent in individual flonase and azelastine in place of dymista

## 2022-10-11 NOTE — Telephone Encounter (Signed)
Great - that is fine with me.   Malachi Bonds, MD [facility]

## 2022-10-11 NOTE — Addendum Note (Signed)
Addended by: Berna Bue on: 10/11/2022 03:12 PM   Modules accepted: Orders

## 2022-10-21 ENCOUNTER — Other Ambulatory Visit (HOSPITAL_COMMUNITY): Payer: Self-pay

## 2022-10-30 ENCOUNTER — Ambulatory Visit: Payer: 59

## 2022-11-07 ENCOUNTER — Ambulatory Visit: Payer: 59

## 2022-11-12 ENCOUNTER — Ambulatory Visit (INDEPENDENT_AMBULATORY_CARE_PROVIDER_SITE_OTHER): Payer: 59

## 2022-11-12 DIAGNOSIS — L501 Idiopathic urticaria: Secondary | ICD-10-CM

## 2022-11-14 ENCOUNTER — Ambulatory Visit: Payer: 59

## 2022-11-15 ENCOUNTER — Ambulatory Visit: Payer: 59

## 2022-11-22 ENCOUNTER — Telehealth: Payer: Self-pay

## 2022-11-22 NOTE — Telephone Encounter (Signed)
Patient called in - DOB verified - stated she has received a call from [facility] the discount coupon has expired for her Xolair and a new one is needed.  Patient advised message will be forwarded to [facility], Biologic Coordinator, to contact [employer].  Patient verbalized understanding, no further questions.

## 2022-11-25 NOTE — Telephone Encounter (Signed)
I called patient and l/m about the copay card having issues. They are working on same and hopefully will have resolved soon

## 2022-11-26 ENCOUNTER — Ambulatory Visit: Payer: 59 | Admitting: Allergy & Immunology

## 2022-11-28 ENCOUNTER — Ambulatory Visit: Payer: Self-pay

## 2022-11-28 ENCOUNTER — Ambulatory Visit: Payer: 59 | Admitting: Allergy & Immunology

## 2022-11-28 ENCOUNTER — Other Ambulatory Visit: Payer: Self-pay

## 2022-11-28 ENCOUNTER — Encounter: Payer: Self-pay | Admitting: Allergy & Immunology

## 2022-11-28 VITALS — BP 120/90 | HR 125 | Temp 98.3°F | Resp 16 | Ht 60.0 in | Wt 147.4 lb

## 2022-11-28 DIAGNOSIS — L501 Idiopathic urticaria: Secondary | ICD-10-CM | POA: Diagnosis not present

## 2022-11-28 DIAGNOSIS — J3089 Other allergic rhinitis: Secondary | ICD-10-CM | POA: Diagnosis not present

## 2022-11-28 DIAGNOSIS — R053 Chronic cough: Secondary | ICD-10-CM

## 2022-11-28 DIAGNOSIS — J302 Other seasonal allergic rhinitis: Secondary | ICD-10-CM

## 2022-11-28 MED ORDER — FAMOTIDINE 40 MG PO TABS: 40.0000 mg | ORAL_TABLET | Freq: Two times a day (BID) | ORAL | 5 refills | Status: DC

## 2022-11-28 MED ORDER — EPINEPHRINE 0.3 MG/0.3ML IJ SOAJ: INTRAMUSCULAR | 1 refills | Status: DC

## 2022-11-28 MED ORDER — IPRATROPIUM BROMIDE 0.03 % NA SOLN: 2.0000 | Freq: Three times a day (TID) | NASAL | 5 refills | Status: DC

## 2022-11-28 MED ORDER — OMEPRAZOLE 40 MG PO CPDR: 40.0000 mg | DELAYED_RELEASE_CAPSULE | Freq: Two times a day (BID) | ORAL | 5 refills | Status: DC

## 2022-11-28 MED ORDER — CARBINOXAMINE MALEATE 4 MG PO TABS: 4.0000 mg | ORAL_TABLET | Freq: Two times a day (BID) | ORAL | 5 refills | Status: DC

## 2022-11-28 NOTE — Progress Notes (Signed)
FOLLOW UP  Date of Service/Encounter:  11/28/22   Assessment:   Seasonal and perennial allergic rhinitis (trees, weeds, grasses, indoor molds, dust mites, cat, dog and cockroach) - was briefly on allergen immunotherapy    Adverse food reaction (seafood) - with negative skin testing to seafood and now tolerates   Shortness of breath - diagnosed as neurogenic cough   Chronic urticaria - well controlled with Xolair monthly    Plan/Recommendations:   1. Seasonal and perennial allergic rhinitis - We need an antihistamine: carbinoxamine 4mg  twice daily - We need a nose spray: ipratropium nasal spray one spray per nostril 3 times daily - Use DAILY to see if this can dry you up.   2. Chronic autoimmune urticaria  - Continue with Xolair monthly as you are doing.   3. GERD - Increase omeprazole 40mg  to TWICE DAILY. - Increase famotidine 40mg  to TWICE DAILY.    Gastroenterology:  Emma Osier, PA-C   [address]   [place]   [place], [place] [address]   [phone]  4. Return in about 6 months (around 05/29/2023).   Subjective:   Emma Larsen is a 52 y.o. female presenting today for follow up of  Chief Complaint  Patient presents with   Follow-up    Pt c/o still having mucus in throat, chest and nasal.    [name] has a history of the following: Patient Active Problem List   Diagnosis Date Noted   Impacted cerumen of both ears 04/30/2021   Tendonitis of wrist, right 03/07/2021   Constipation 12/02/2019   Allergy to environmental factors 12/02/2019   Ear itching 11/09/2019   Globus sensation 11/09/2019   Schatzki's ring 10/19/2019   Chronic cough 09/08/2019   Dysphonia 09/08/2019   Laryngospasm 09/08/2019   Sinusitis, acute 07/08/2018   Simple chronic bronchitis ([place]) 06/03/2018   Seasonal and perennial allergic rhinitis 02/19/2018   SOB (shortness of breath) 02/16/2018   Adverse food reaction 02/16/2018   Chronic rhinitis  02/16/2018   Gastroesophageal reflux disease 02/16/2018    History obtained from: chart review and patient.  Emma Larsen is a 52 y.o. female presenting for a follow up visit.  She was last seen in October 2023.  At that time, we continued with Xhance 1 spray per nostril daily.  For her urticaria, she continue with Xolair monthly.  For her GERD, we started omeprazole 40 mg daily and continue with famotidine 40 mg daily. We also encouraged her to make a follow up appointment with her gastroenterologist.   Since last visit, she has done well. She is in the middle of a separation. This has been stressful for her, but she seems happy with the decision. They have almost been separated for an entire year which is what is necessary in [place]. She is excited that she has submitted for school loan forgiveness from the [facility] program. She has been working in [employer] for over 20 years. She is excited that this is so close to being gone. She never knew about the program and is excited that she found out about it.   Asthma/Respiratory Symptom History: She notices that she has worsening coughing when she is emotional and this would cause reflux symptoms and emesis of her phlegm. She is flaring with ingestion of a mixed drink. This can trigger the coughing as well. She has some reflux medications, but it is not clear that she is taking them on a routine basis. She does have  the gabapentin to use as needed.   Allergic Rhinitis Symptom History: She continues to have a lot of phelgm production in her sinuses. She does have a lot of different medications but it is not clear which ones that she is using on a routine basis. She has some nose sprays, but she is not sure which one to use. Her compliance has never been awesome, but she is willing to try again.   Skin Symptom History: She remains on the Xolair and this is helping with her hives. These have not been a problem for several months at this point. She  prefers to remain on the Xolair since she has done so well with it.   GERD Symptom History: She is on omeprazole and famotidine daily.  She is not doing them twice daily but she is open to doing that. She needs to go see GI again and needs the contact information for that. She last had an EGD in April 2021 that was normal without diagnostic abnormalities (presuming no eosinophils found).   Last EGD from April 2021:     Otherwise, there have been no changes to her past medical history, surgical history, family history, or social history.    Review of Systems  Constitutional: Negative.  Negative for chills, fever, malaise/fatigue and weight loss.  HENT:  Positive for congestion. Negative for ear discharge and ear pain.        Positive for postnasal drip.  Positive for productive cough.  Eyes:  Negative for pain, discharge and redness.  Respiratory:  Positive for cough. Negative for sputum production, shortness of breath and wheezing.   Cardiovascular: Negative.  Negative for chest pain and palpitations.  Gastrointestinal:  Negative for abdominal pain, constipation, diarrhea, heartburn, nausea and vomiting.  Skin: Negative.  Negative for itching and rash.  Neurological:  Negative for dizziness and headaches.  Endo/Heme/Allergies:  Negative for environmental allergies. Does not bruise/bleed easily.  All other systems reviewed and are negative.      Objective:   Blood pressure (!) 120/90, pulse (!) 125, temperature 98.3 F (36.8 C), resp. rate 16, height 5' (1.524 m), weight 147 lb 6.4 oz (66.9 kg), SpO2 98 %. Body mass index is 28.79 kg/m.    Physical Exam Vitals reviewed.  Constitutional:      Appearance: She is well-developed.     Comments: Talkative.  HENT:     Head: Normocephalic and atraumatic.     Right Ear: Tympanic membrane, ear canal and external ear normal.     Left Ear: Tympanic membrane, ear canal and external ear normal.     Nose: No nasal deformity, septal  deviation, mucosal edema or rhinorrhea.     Right Turbinates: Enlarged, swollen and pale.     Left Turbinates: Enlarged, swollen and pale.     Right Sinus: No maxillary sinus tenderness or frontal sinus tenderness.     Left Sinus: No maxillary sinus tenderness or frontal sinus tenderness.     Mouth/Throat:     Mouth: Mucous membranes are not pale and not dry.     Pharynx: Uvula midline.     Comments: Cobblestoning in the posterior oropharynx. Eyes:     General: Lids are normal. No allergic shiner.       Right eye: No discharge.        Left eye: No discharge.     Conjunctiva/sclera: Conjunctivae normal.     Right eye: Right conjunctiva is not injected. No chemosis.    Left eye: Left  conjunctiva is not injected. No chemosis.    Pupils: Pupils are equal, round, and reactive to light.  Cardiovascular:     Rate and Rhythm: Normal rate and regular rhythm.     Heart sounds: Normal heart sounds.  Pulmonary:     Effort: Pulmonary effort is normal. No tachypnea, accessory muscle usage or respiratory distress.     Breath sounds: Normal breath sounds. No wheezing, rhonchi or rales.     Comments: Moving air well in all lung fields. No increased work of breathing noted.  Chest:     Chest wall: No tenderness.  Lymphadenopathy:     Cervical: No cervical adenopathy.  Skin:    General: Skin is warm.     Capillary Refill: Capillary refill takes less than 2 seconds.     Coloration: Skin is not pale.     Findings: No abrasion, erythema, petechiae or rash. Rash is not papular, urticarial or vesicular.     Comments: No urticaria noted today.   Neurological:     Mental Status: She is alert.  Psychiatric:        Behavior: Behavior is cooperative.      Diagnostic studies: none     Salvatore Marvel, MD  [facility] [place]

## 2022-11-28 NOTE — Patient Instructions (Addendum)
1. Seasonal and perennial allergic rhinitis - We need an antihistamine: carbinoxamine 4mg  twice daily - We need a nose spray: ipratropium nasal spray one spray per nostril 3 times daily - Use DAILY to see if this can dry you up.   2. Chronic autoimmune urticaria  - Continue with Xolair monthly as you are doing.   3. GERD - Increase omeprazole 40mg  to TWICE DAILY. - Increase famotidine 40mg  to TWICE DAILY.    Gastroenterology:  Eduardo Osier, PA-C   [address]   [place]   [place], [place] [address]   [phone]  4. Return in about 6 months (around 05/29/2023).    Please inform us of any Emergency Department visits, hospitalizations, or changes in symptoms. Call us before going to the ED for breathing or allergy symptoms since we might be able to fit you in for a sick visit. Feel free to contact us anytime with any questions, problems, or concerns.  It was a pleasure to see you today!  Websites that have reliable patient information: 1. American Academy of Asthma, Allergy, and Immunology: www.aaaai.org 2. Food Allergy Research and Education (FARE): foodallergy.org 3. Mothers of Asthmatics: http://www.asthmacommunitynetwork.org 4. American College of Allergy, Asthma, and Immunology: www.acaai.org   COVID-19 Vaccine Information can be found at: ShippingScam.co.uk For questions related to vaccine distribution or appointments, please email [email or web address] or call [phone].   We realize that you might be concerned about having an allergic reaction to the COVID19 vaccines. To help with that concern, WE ARE OFFERING THE COVID19 VACCINES IN OUR OFFICE! Ask the front desk for dates!     "Like" [place] on Facebook and Instagram for our latest updates!      A healthy democracy works best when [employer] participate! Make sure you are registered to vote! If you have moved or changed any of your  contact information, you will need to get this updated before voting!  In some cases, you MAY be able to register to vote online: [email or web address]

## 2022-11-30 ENCOUNTER — Encounter: Payer: Self-pay | Admitting: Allergy & Immunology

## 2022-12-10 ENCOUNTER — Ambulatory Visit: Payer: 59

## 2022-12-12 ENCOUNTER — Ambulatory Visit (INDEPENDENT_AMBULATORY_CARE_PROVIDER_SITE_OTHER): Payer: 59

## 2022-12-12 DIAGNOSIS — L501 Idiopathic urticaria: Secondary | ICD-10-CM

## 2023-01-09 ENCOUNTER — Ambulatory Visit (INDEPENDENT_AMBULATORY_CARE_PROVIDER_SITE_OTHER): Payer: 59

## 2023-01-09 DIAGNOSIS — L501 Idiopathic urticaria: Secondary | ICD-10-CM

## 2023-01-20 ENCOUNTER — Telehealth: Payer: Self-pay

## 2023-01-20 NOTE — Telephone Encounter (Signed)
[facility] called - DOB verified - to advise/confirm delivery of patient's Xolair on tomorrow, Tuesday, 01/21/23 via [employer].   Luvenia Starch was advised of office's address, hours of operation and hours office is closed for lunch.  Forwarding message to our Manufacturing engineer as update.

## 2023-02-06 ENCOUNTER — Ambulatory Visit (INDEPENDENT_AMBULATORY_CARE_PROVIDER_SITE_OTHER): Payer: 59

## 2023-02-06 DIAGNOSIS — L501 Idiopathic urticaria: Secondary | ICD-10-CM

## 2023-02-18 ENCOUNTER — Emergency Department (HOSPITAL_BASED_OUTPATIENT_CLINIC_OR_DEPARTMENT_OTHER)
Admission: EM | Admit: 2023-02-18 | Discharge: 2023-02-18 | Disposition: A | Discharge status: Home / Self Care | Payer: 59 | Attending: Emergency Medicine | Admitting: Emergency Medicine

## 2023-02-18 ENCOUNTER — Other Ambulatory Visit: Payer: Self-pay

## 2023-02-18 DIAGNOSIS — R197 Diarrhea, unspecified: Secondary | ICD-10-CM | POA: Insufficient documentation

## 2023-02-18 DIAGNOSIS — Z7951 Long term (current) use of inhaled steroids: Secondary | ICD-10-CM | POA: Insufficient documentation

## 2023-02-18 DIAGNOSIS — R112 Nausea with vomiting, unspecified: Secondary | ICD-10-CM | POA: Insufficient documentation

## 2023-02-18 DIAGNOSIS — J45909 Unspecified asthma, uncomplicated: Secondary | ICD-10-CM | POA: Insufficient documentation

## 2023-02-18 DIAGNOSIS — Z1152 Encounter for screening for COVID-19: Secondary | ICD-10-CM | POA: Diagnosis not present

## 2023-02-18 DIAGNOSIS — R000 Tachycardia, unspecified: Secondary | ICD-10-CM | POA: Diagnosis not present

## 2023-02-18 LAB — COMPREHENSIVE METABOLIC PANEL
ALT: 17 U/L (ref 0–44)
AST: 21 U/L (ref 15–41)
Albumin: 4.6 g/dL (ref 3.5–5.0)
Alkaline Phosphatase: 65 U/L (ref 38–126)
Anion gap: 10 (ref 5–15)
BUN: 14 mg/dL (ref 6–20)
CO2: 26 mmol/L (ref 22–32)
Calcium: 9.9 mg/dL (ref 8.9–10.3)
Chloride: 104 mmol/L (ref 98–111)
Creatinine, Ser: 0.95 mg/dL (ref 0.44–1.00)
GFR, Estimated: >60 mL/min (ref >60)
Glucose, Bld: 116 mg/dL — ABNORMAL HIGH (ref 70–99)
Potassium: 3.5 mmol/L (ref 3.5–5.1)
Sodium: 140 mmol/L (ref 135–145)
Total Bilirubin: 0.6 mg/dL (ref 0.3–1.2)
Total Protein: 8 g/dL (ref 6.5–8.1)

## 2023-02-18 LAB — CBC WITH DIFFERENTIAL/PLATELET
Abs Immature Granulocytes: 0.02 10*3/uL (ref 0.00–0.07)
Basophils Absolute: 0 10*3/uL (ref 0.0–0.1)
Basophils Relative: 0 %
Eosinophils Absolute: 0 10*3/uL (ref 0.0–0.5)
Eosinophils Relative: 0 %
HCT: 46.3 % — ABNORMAL HIGH (ref 36.0–46.0)
Hemoglobin: 15.3 g/dL — ABNORMAL HIGH (ref 12.0–15.0)
Immature Granulocytes: 0 %
Lymphocytes Relative: 4 %
Lymphs Abs: 0.4 10*3/uL — ABNORMAL LOW (ref 0.7–4.0)
MCH: 29.2 pg (ref 26.0–34.0)
MCHC: 33 g/dL (ref 30.0–36.0)
MCV: 88.4 fL (ref 80.0–100.0)
Monocytes Absolute: 0.1 10*3/uL (ref 0.1–1.0)
Monocytes Relative: 2 %
Neutro Abs: 8.3 10*3/uL — ABNORMAL HIGH (ref 1.7–7.7)
Neutrophils Relative %: 94 %
Platelets: 239 10*3/uL (ref 150–400)
RBC: 5.24 MIL/uL — ABNORMAL HIGH (ref 3.87–5.11)
RDW: 12 % (ref 11.5–15.5)
WBC: 8.8 10*3/uL (ref 4.0–10.5)
nRBC: 0 % (ref 0.0–0.2)

## 2023-02-18 LAB — RESP PANEL BY RT-PCR (RSV, FLU A&B, COVID)  RVPGX2
Influenza A by PCR: NEGATIVE
Influenza B by PCR: NEGATIVE
Resp Syncytial Virus by PCR: NEGATIVE
SARS Coronavirus 2 by RT PCR: NEGATIVE

## 2023-02-18 LAB — URINALYSIS, ROUTINE W REFLEX MICROSCOPIC
Bacteria, UA: NONE SEEN
Bilirubin Urine: NEGATIVE
Glucose, UA: NEGATIVE mg/dL
Ketones, ur: NEGATIVE mg/dL
Leukocytes,Ua: NEGATIVE
Nitrite: NEGATIVE
Specific Gravity, Urine: 1.034 — ABNORMAL HIGH (ref 1.005–1.030)
pH: 5.5 (ref 5.0–8.0)

## 2023-02-18 LAB — LIPASE, BLOOD: Lipase: 14 U/L (ref 11–51)

## 2023-02-18 LAB — HCG, QUANTITATIVE, PREGNANCY: hCG, Beta Chain, Quant, S: 3 m[IU]/mL (ref <5)

## 2023-02-18 MED ORDER — ONDANSETRON 4 MG PO TBDP: 4.0000 mg | ORAL_TABLET | Freq: Three times a day (TID) | ORAL | 0 refills | Status: DC | PRN

## 2023-02-18 MED ORDER — SODIUM CHLORIDE 0.9 % IV BOLUS
1000.0000 mL | Freq: Once | INTRAVENOUS | Status: AC
  Administered 2023-02-18: 1000 mL via INTRAVENOUS

## 2023-02-18 MED ORDER — ONDANSETRON HCL 4 MG/2ML IJ SOLN
4.0000 mg | Freq: Once | INTRAMUSCULAR | Status: AC
  Administered 2023-02-18: 4 mg via INTRAVENOUS
  Filled 2023-02-18: qty 2

## 2023-02-18 MED ORDER — LOPERAMIDE HCL 2 MG PO CAPS: 2.0000 mg | ORAL_CAPSULE | Freq: Four times a day (QID) | ORAL | 0 refills | Status: DC | PRN

## 2023-02-18 MED ORDER — MORPHINE SULFATE (PF) 4 MG/ML IV SOLN
4.0000 mg | Freq: Once | INTRAVENOUS | Status: AC
  Administered 2023-02-18: 4 mg via INTRAVENOUS
  Filled 2023-02-18: qty 1

## 2023-02-18 MED ORDER — ACETAMINOPHEN 325 MG PO TABS
650.0000 mg | ORAL_TABLET | Freq: Once | ORAL | Status: AC
  Administered 2023-02-18: 650 mg via ORAL
  Filled 2023-02-18: qty 2
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
## 2023-02-18 NOTE — ED Triage Notes (Signed)
Pt arrived POV, stated she started having diarrhea and was fatigued really bad yesterday. Become more lethargic with generalized body aches and think she passed out yesterday when getting in bed. Said she was shaking and had thrown up. Took some pepto this am but threw it up also.  A/ox4

## 2023-02-18 NOTE — Discharge Instructions (Addendum)
Please take your medications as prescribed. Take tylenol/ibuprofen for pain/fever. I recommend close follow-up with PCP for reevaluation.  Please do not hesitate to return to emergency department if worrisome signs symptoms we discussed become apparent.  

## 2023-03-06 ENCOUNTER — Ambulatory Visit: Payer: 59

## 2023-03-07 ENCOUNTER — Ambulatory Visit (INDEPENDENT_AMBULATORY_CARE_PROVIDER_SITE_OTHER): Payer: 59

## 2023-03-07 DIAGNOSIS — L501 Idiopathic urticaria: Secondary | ICD-10-CM

## 2023-04-04 ENCOUNTER — Ambulatory Visit: Payer: 59

## 2023-04-16 ENCOUNTER — Ambulatory Visit: Payer: 59

## 2023-04-20 IMAGING — CT CT MAXILLOFACIAL W/O CM
1 series · 15 of 30 positions shown, 19 images · non-contrast
Comparison: None.

CLINICAL DATA: Recurrent sinusitis.  Face pain

EXAM:
CT MAXILLOFACIAL WITHOUT CONTRAST
TECHNIQUE: Multidetector CT images of the paranasal sinuses were obtained using
the standard protocol without intravenous contrast.

[Series 5: maxofacial soft · axial · 0.38mm/px · z∈[+492,+600]mm · 15 of 60 slices shown, 19 images]
[im 3/60  brain]
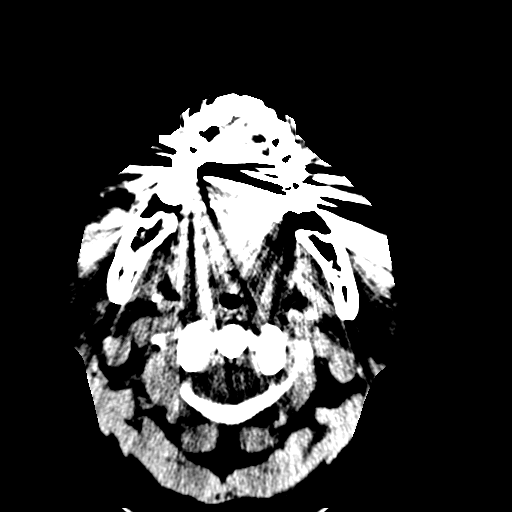
[im 3/60  bone]
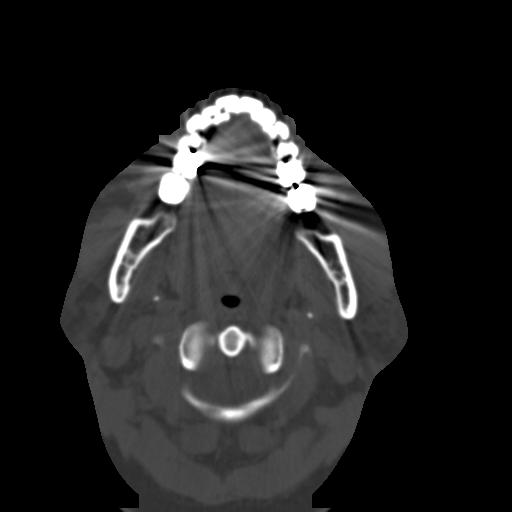
[im 7/60  bone]
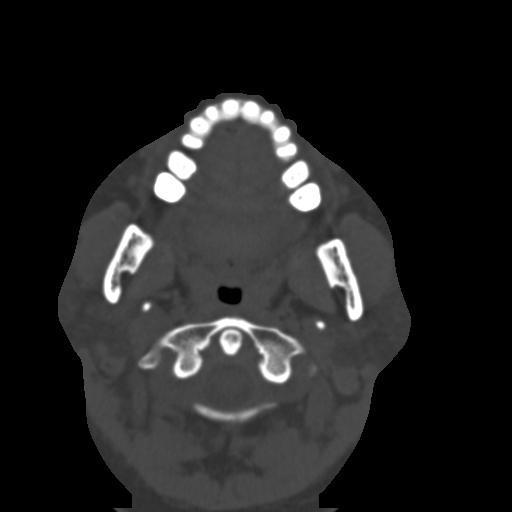
[im 11/60  bone]
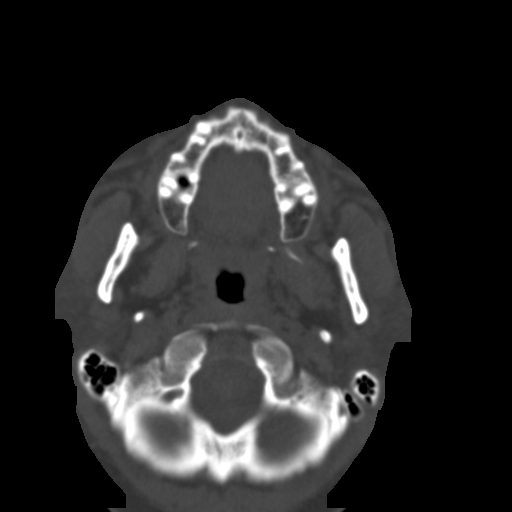
[im 15/60  bone]
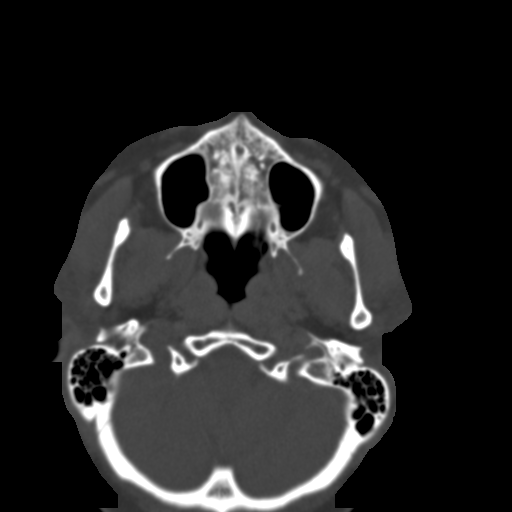
[im 19/60  brain]
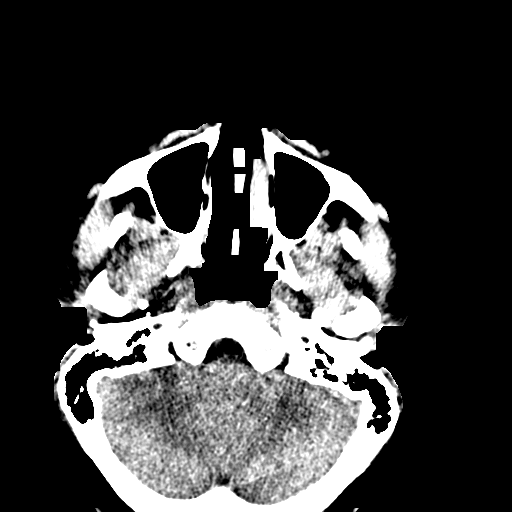
[im 19/60  bone]
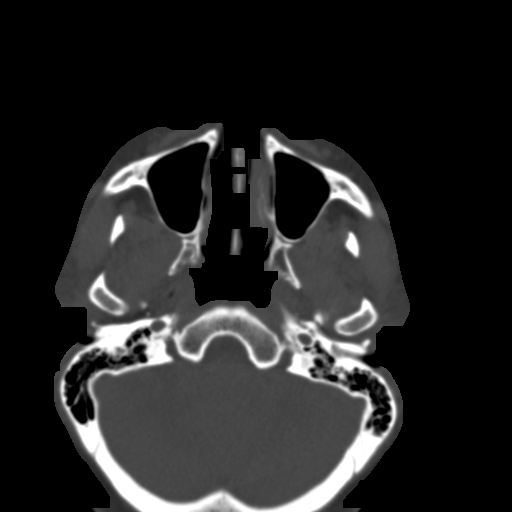
[im 23/60  bone]
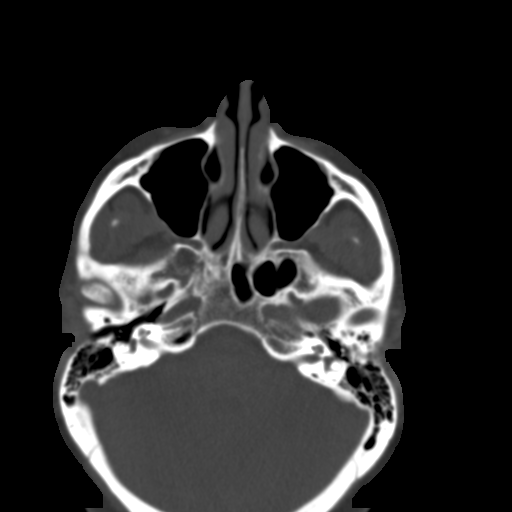
[im 27/60  bone]
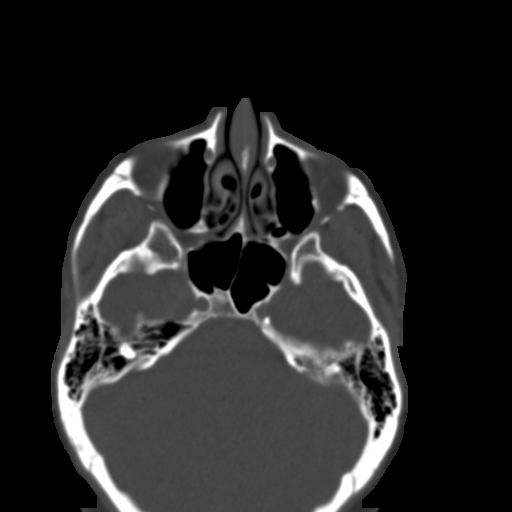
[im 31/60  bone]
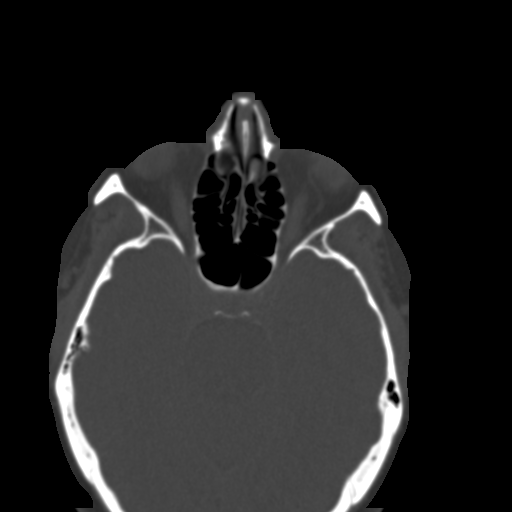
[im 33/60  brain]
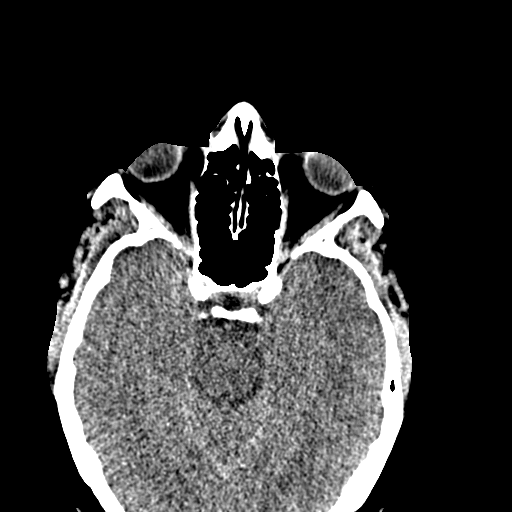
[im 33/60  bone]
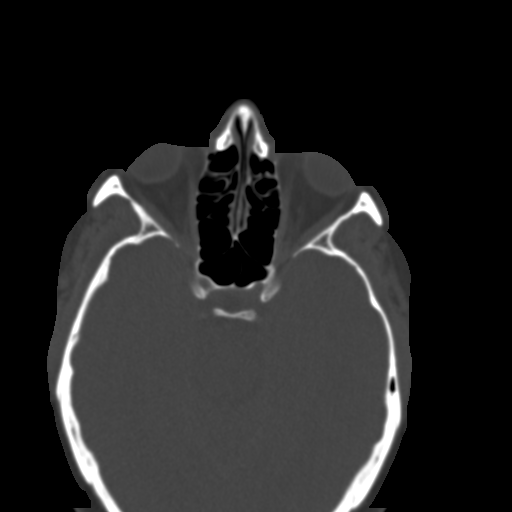
[im 37/60  bone]
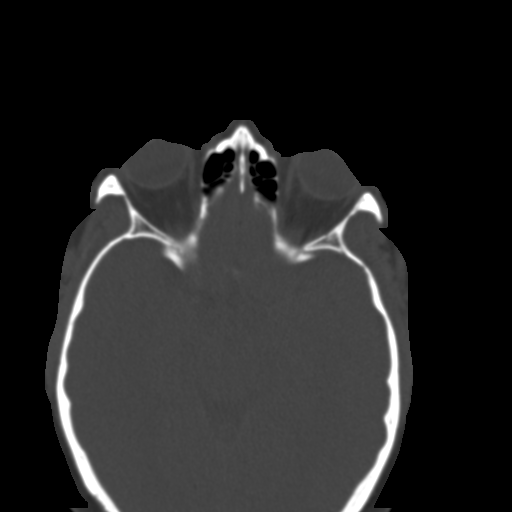
[im 41/60  bone]
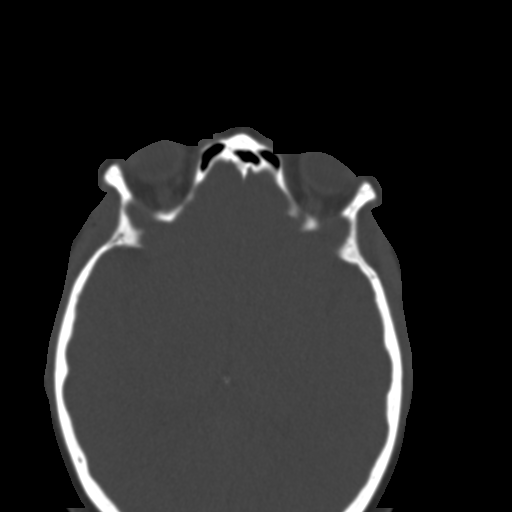
[im 45/60  bone]
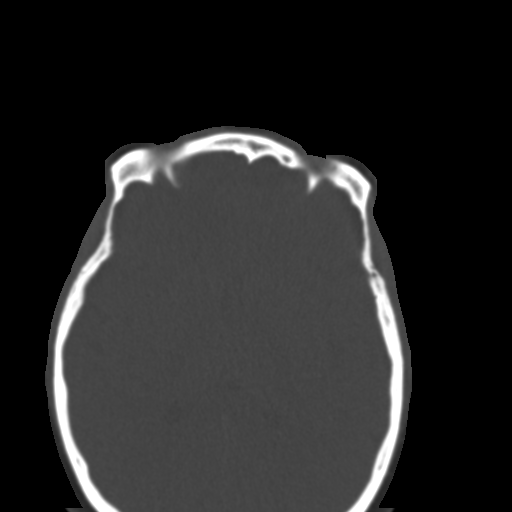
[im 49/60  brain]
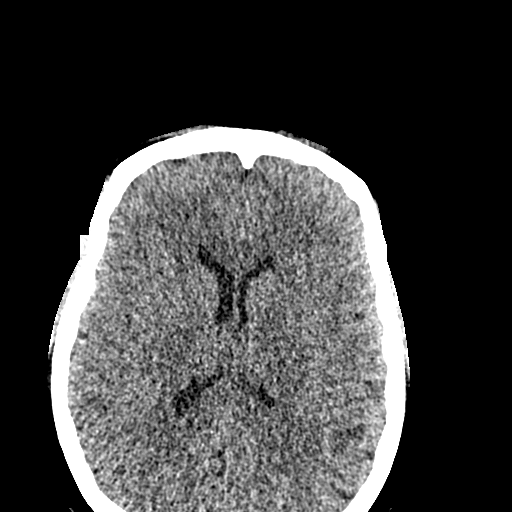
[im 49/60  bone]
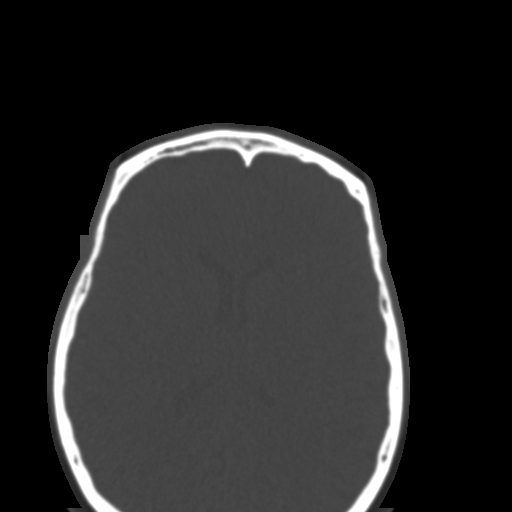
[im 53/60  bone]
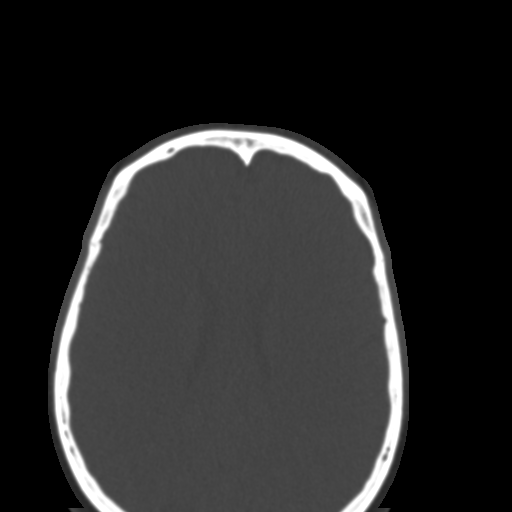
[im 57/60  bone]
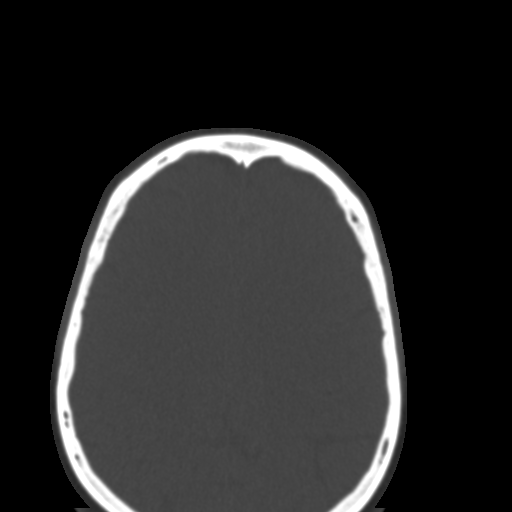

[15 of 30 positions shown; findings below may reference images not displayed]

FINDINGS: Paranasal sinuses:

Frontal: Normally aerated. Patent frontal sinus drainage pathways.

Ethmoid: Normally aerated.

Maxillary: Normally aerated.

Sphenoid: Normally aerated. Patent sphenoethmoidal recesses.

Right ostiomeatal unit: Narrow middle meatus due to concha bullosa.
Narrow infundibulum due to prominent ethmoid bulla.

Left ostiomeatal unit: Narrow middle meatus due to concha bullosa.
Narrow infundibulum due to prominent ethmoid bulla.

Nasal passages: Patent. Congested appearance of nasal mucosa. Slight
leftward septal deviation.

Anatomy: No pneumatization superior to anterior ethmoid notches.
Sellar sphenoid pneumatization pattern. No dehiscence of carotid or
optic canals. No onodi cell.

Other: No incidental finding on soft tissue windows.
IMPRESSION: 1.  No active sinusitis.
2. Congested appearance of nasal mucosa. Right more than left concha
bullosa narrowing the bilateral middle meatus.

## 2023-04-21 ENCOUNTER — Ambulatory Visit (INDEPENDENT_AMBULATORY_CARE_PROVIDER_SITE_OTHER): Payer: 59 | Admitting: *Deleted

## 2023-04-21 DIAGNOSIS — L501 Idiopathic urticaria: Secondary | ICD-10-CM

## 2023-04-27 ENCOUNTER — Other Ambulatory Visit: Payer: Self-pay | Admitting: Allergy & Immunology

## 2023-05-19 ENCOUNTER — Ambulatory Visit: Payer: 59

## 2023-05-20 ENCOUNTER — Ambulatory Visit (INDEPENDENT_AMBULATORY_CARE_PROVIDER_SITE_OTHER): Payer: 59 | Admitting: *Deleted

## 2023-05-20 DIAGNOSIS — L501 Idiopathic urticaria: Secondary | ICD-10-CM | POA: Diagnosis not present

## 2023-05-27 DIAGNOSIS — Z01419 Encounter for gynecological examination (general) (routine) without abnormal findings: Secondary | ICD-10-CM | POA: Diagnosis not present

## 2023-05-27 DIAGNOSIS — N951 Menopausal and female climacteric states: Secondary | ICD-10-CM | POA: Diagnosis not present

## 2023-05-27 DIAGNOSIS — Z124 Encounter for screening for malignant neoplasm of cervix: Secondary | ICD-10-CM | POA: Diagnosis not present

## 2023-05-27 DIAGNOSIS — Z113 Encounter for screening for infections with a predominantly sexual mode of transmission: Secondary | ICD-10-CM | POA: Diagnosis not present

## 2023-05-27 DIAGNOSIS — Z01411 Encounter for gynecological examination (general) (routine) with abnormal findings: Secondary | ICD-10-CM | POA: Diagnosis not present

## 2023-05-29 ENCOUNTER — Ambulatory Visit: Payer: 59 | Admitting: Allergy & Immunology

## 2023-06-04 DIAGNOSIS — M542 Cervicalgia: Secondary | ICD-10-CM | POA: Diagnosis not present

## 2023-06-04 DIAGNOSIS — M79645 Pain in left finger(s): Secondary | ICD-10-CM | POA: Diagnosis not present

## 2023-06-12 ENCOUNTER — Other Ambulatory Visit (HOSPITAL_COMMUNITY): Payer: Self-pay

## 2023-06-14 ENCOUNTER — Encounter (HOSPITAL_BASED_OUTPATIENT_CLINIC_OR_DEPARTMENT_OTHER): Payer: Self-pay

## 2023-06-14 ENCOUNTER — Other Ambulatory Visit: Payer: Self-pay

## 2023-06-14 ENCOUNTER — Emergency Department (HOSPITAL_BASED_OUTPATIENT_CLINIC_OR_DEPARTMENT_OTHER)
Admission: EM | Admit: 2023-06-14 | Discharge: 2023-06-14 | Disposition: A | Discharge status: Home / Self Care | Payer: Medicaid Other | Attending: Emergency Medicine | Admitting: Emergency Medicine

## 2023-06-14 DIAGNOSIS — R0789 Other chest pain: Secondary | ICD-10-CM | POA: Diagnosis not present

## 2023-06-14 DIAGNOSIS — R059 Cough, unspecified: Secondary | ICD-10-CM | POA: Diagnosis present

## 2023-06-14 DIAGNOSIS — U071 COVID-19: Secondary | ICD-10-CM | POA: Diagnosis not present

## 2023-06-14 LAB — RESP PANEL BY RT-PCR (RSV, FLU A&B, COVID)  RVPGX2
Influenza A by PCR: NEGATIVE
Influenza B by PCR: NEGATIVE
Resp Syncytial Virus by PCR: NEGATIVE
SARS Coronavirus 2 by RT PCR: POSITIVE — AB

## 2023-06-14 NOTE — Discharge Instructions (Signed)
Follow all instructions discussed at bedside. Get help right away if: You have trouble breathing or get short of breath. You have pain or pressure in your chest. You cannot speak or move any part of your body. You are confused. Your symptoms get worse. These symptoms may be an emergency. Get help right away. Call 911. Do not wait to see if the symptoms will go away. Do not drive yourself to the hospital.

## 2023-06-14 NOTE — ED Provider Notes (Signed)
[place] EMERGENCY DEPARTMENT AT [facility] Provider Note   CSN: [id number] Arrival date & time: 06/14/23  0865     History  Chief Complaint  Patient presents with   Headache   Sinusitis    Emma Larsen is a 52 y.o. female.  Who presents emergency department with chief complaint of URI symptoms.  Patient was exposed to COVID last weekend.  She started having symptoms 4 days ago including sore throat cough sinus pressure headache.  She has had associated chills, body aches and subjective fever at home.  She was not sure if she could have a sinus problem or COVID.  She has no other complaints   Headache Sinusitis Associated symptoms: headaches        Home Medications Prior to Admission medications   Medication Sig Start Date End Date Taking? Authorizing Provider  ascorbic acid (VITAMIN C) 100 MG tablet Take by mouth.    [provider]  azelastine (ASTELIN) 0.1 % nasal spray Place 2 sprays into both nostrils 2 (two) times daily. Use in each nostril as directed 10/11/22   Alfonse Spruce, MD  Carbinoxamine Maleate 4 MG TABS Take 1 tablet (4 mg total) by mouth in the morning and at bedtime. 11/28/22 12/28/22  Alfonse Spruce, MD  diclofenac Sodium (VOLTAREN) 1 % GEL APPLY 2 GRAMS TO THE AFFECTED AREA(S) BY TOPICAL ROUTE 4 TIMES PER DAY 12/13/20   [provider]  EPINEPHrine (AUVI-Q) 0.3 mg/0.3 mL IJ SOAJ injection Use as directed for severe allergic reaction 11/28/22   Alfonse Spruce, MD  famotidine (PEPCID) 40 MG tablet Take 1 tablet (40 mg total) by mouth 2 (two) times daily. 11/28/22 12/28/22  Alfonse Spruce, MD  fluticasone [facility]) 50 MCG/ACT nasal spray Place 2 sprays into both nostrils daily. 10/11/22   Alfonse Spruce, MD  Fluticasone Propionate Timmothy Sours) 93 MCG/ACT EXHU Apply one spray per nostril daily. 08/22/22   Alfonse Spruce, MD  Humidifiers (COOL MIST HUMIDIFIER 1.3 GAL) MISC U D PRN FOR CONGESTION 12/04/18    [provider]  ibuprofen (ADVIL) 800 MG tablet     [provider]  ipratropium (ATROVENT) 0.03 % nasal spray Place 2 sprays into both nostrils 3 (three) times daily. 11/28/22   Alfonse Spruce, MD  loperamide (IMODIUM) 2 MG capsule Take 1 capsule (2 mg total) by mouth 4 (four) times daily as needed for diarrhea or loose stools. 02/18/23   Jeanelle Malling, PA  Multiple Vitamins-Calcium (ONE-A-DAY WOMENS FORMULA) TABS TK 1 T PO QD 12/22/18   [provider]  NEOMYCIN-POLYMYXIN-HYDROCORTISONE (CORTISPORIN) 1 % SOLN OTIC solution Apply 1-2 drops to toe BID after soaking 10/02/21   Park Liter, DPM  norethindrone-ethinyl estradiol (LOESTRIN) 1-20 MG-MCG tablet Take 1 tablet by mouth daily. 10/22/21   [provider]  NURTEC 75 MG TBDP Take by mouth. 10/17/21   [provider]  omeprazole (PRILOSEC) 40 MG capsule Take 1 capsule (40 mg total) by mouth in the morning and at bedtime. 11/28/22 12/28/22  Alfonse Spruce, MD  ondansetron (ZOFRAN-ODT) 4 MG disintegrating tablet Take 1 tablet (4 mg total) by mouth every 8 (eight) hours as needed for nausea or vomiting. 02/18/23   Jeanelle Malling, PA  Probiotic Product (DIGESTIVE ADVANTAGE GUMMIES) CHEW CHEW ONE GUMMIE QD UTD Patient not taking: Reported on 11/28/2022 12/01/18   [provider]  SUMAtriptan (IMITREX) 25 MG tablet Take 25 mg by mouth every 2 (two) hours as needed for migraine.  Patient not taking: Reported on 11/28/2022 06/07/18   [provider]  Vitamin D, Ergocalciferol, (DRISDOL) 1.25 MG (50000 UNIT) CAPS capsule Take 1 capsule by mouth once a week. 04/01/21   [provider]  [name] 150 MG/ML prefilled syringe INJECT 300 MG INTO THE SKIN  EVERY 28 DAYS 04/28/23   Alfonse Spruce, MD      Allergies    Sulfa antibiotics    Review of Systems   Review of Systems  Neurological:  Positive for headaches.    Physical Exam Updated Vital Signs BP 131/85 (BP Location: Right Arm)    Pulse 89   Temp 99.2 F (37.3 C) (Oral)   Resp 20   Ht 5\' 2"  (1.575 m)   Wt 66.7 kg   SpO2 100%   BMI 26.90 kg/m  Physical Exam Vitals and nursing note reviewed.  Constitutional:      General: She is not in acute distress.    Appearance: She is well-developed. She is ill-appearing. She is not diaphoretic.  HENT:     Head: Normocephalic and atraumatic.     Right Ear: External ear normal.     Left Ear: External ear normal.     Nose: Nose normal.     Mouth/Throat:     Mouth: Mucous membranes are moist.  Eyes:     General: No scleral icterus.    Conjunctiva/sclera: Conjunctivae normal.  Cardiovascular:     Rate and Rhythm: Normal rate and regular rhythm.     Heart sounds: Normal heart sounds. No murmur heard.    No friction rub. No gallop.  Pulmonary:     Effort: Pulmonary effort is normal. No respiratory distress.     Breath sounds: Normal breath sounds.  Abdominal:     General: Bowel sounds are normal. There is no distension.     Palpations: Abdomen is soft. There is no mass.     Tenderness: There is no abdominal tenderness. There is no guarding.  Musculoskeletal:     Cervical back: Normal range of motion.  Skin:    General: Skin is warm and dry.  Neurological:     Mental Status: She is alert and oriented to person, place, and time.  Psychiatric:        Behavior: Behavior normal.     ED Results / Procedures / Treatments   Labs (all labs ordered are listed, but only abnormal results are displayed) Labs Reviewed  RESP PANEL BY RT-PCR (RSV, FLU A&B, COVID)  RVPGX2    EKG None  Radiology No results found.  Procedures Procedures    Medications Ordered in ED Medications - No data to display  ED Course/ Medical Decision Making/ A&P Clinical Course as of 06/14/23 1030  Sat Jun 14, 2023  1028 SARS Coronavirus 2 by RT PCR(!): POSITIVE [AH]  1028 EKG 12-Lead EKG shows normal sinus rhythm at a rate of 70.  Patient was reporting some intermittent chest pain  which she has had for some time due to stress in her life.  She denies pleuritic chest pain, exertional dyspnea, shortness of breath. [AH]    Clinical Course User Index [AH] Arthor Captain, PA-C                                 Medical Decision Making 52 year old female who who presents emergency department with flulike symptoms after being exposed to COVID last week.  She is COVID-positive.  Patient  reports that she has been under severe stress at home.  She has had intermittent short bouts of fleeting chest pain.  She had some here.  She is a very normal EKG.  I have extremely low suspicion for ACS PE, pneumonia or other life-threatening or fairly morbid cause of her chest pain.  She is otherwise ill but not toxic appearing and appears appropriate for discharge with close outpatient follow-up and return precautions discussed.  Amount and/or Complexity of Data Reviewed Labs:  Decision-making details documented in ED Course. ECG/medicine tests:  Decision-making details documented in ED Course.           Final Clinical Impression(s) / ED Diagnoses Final diagnoses:  COVID-19 virus infection    Rx / DC Orders ED Discharge Orders     None         Arthor Captain, PA-C 06/14/23 1032    Terald Sleeper, MD 06/14/23 [phone]

## 2023-06-14 NOTE — ED Notes (Signed)
Patient reporting intermittent generalized chest pain. States it has been a result of recent increased stressors in her life. Denies pain at this time but wanted to notified the provider while she was being seen in the emergency department. Rolan Bucco PA-C notified.

## 2023-06-14 NOTE — ED Triage Notes (Signed)
Patient arrives with complaints of productive cough, fatigue, and sinus pressure x2 days. States that she was exposed to covid.

## 2023-06-17 ENCOUNTER — Ambulatory Visit: Payer: 59

## 2023-06-24 ENCOUNTER — Ambulatory Visit (INDEPENDENT_AMBULATORY_CARE_PROVIDER_SITE_OTHER): Payer: Medicaid Other | Admitting: *Deleted

## 2023-06-24 DIAGNOSIS — L501 Idiopathic urticaria: Secondary | ICD-10-CM | POA: Diagnosis not present

## 2023-07-21 DIAGNOSIS — M79645 Pain in left finger(s): Secondary | ICD-10-CM | POA: Diagnosis not present

## 2023-07-21 DIAGNOSIS — M542 Cervicalgia: Secondary | ICD-10-CM | POA: Diagnosis not present

## 2023-07-22 ENCOUNTER — Ambulatory Visit (INDEPENDENT_AMBULATORY_CARE_PROVIDER_SITE_OTHER): Payer: Medicaid Other | Admitting: *Deleted

## 2023-07-22 DIAGNOSIS — L501 Idiopathic urticaria: Secondary | ICD-10-CM

## 2023-07-29 ENCOUNTER — Other Ambulatory Visit: Payer: Self-pay

## 2023-07-29 ENCOUNTER — Ambulatory Visit (INDEPENDENT_AMBULATORY_CARE_PROVIDER_SITE_OTHER): Payer: Medicaid Other | Admitting: Allergy & Immunology

## 2023-07-29 ENCOUNTER — Encounter: Payer: Self-pay | Admitting: Allergy & Immunology

## 2023-07-29 VITALS — BP 140/100 | HR 88 | Temp 98.2°F | Ht 62.0 in | Wt 152.2 lb

## 2023-07-29 DIAGNOSIS — J329 Chronic sinusitis, unspecified: Secondary | ICD-10-CM | POA: Diagnosis not present

## 2023-07-29 DIAGNOSIS — J3089 Other allergic rhinitis: Secondary | ICD-10-CM | POA: Diagnosis not present

## 2023-07-29 DIAGNOSIS — R053 Chronic cough: Secondary | ICD-10-CM

## 2023-07-29 DIAGNOSIS — L501 Idiopathic urticaria: Secondary | ICD-10-CM

## 2023-07-29 DIAGNOSIS — J302 Other seasonal allergic rhinitis: Secondary | ICD-10-CM | POA: Diagnosis not present

## 2023-07-29 MED ORDER — PANTOPRAZOLE SODIUM 40 MG PO TBEC: 40.0000 mg | DELAYED_RELEASE_TABLET | Freq: Two times a day (BID) | ORAL | 1 refills | Status: DC

## 2023-07-29 NOTE — Progress Notes (Unsigned)
FOLLOW UP  Date of Service/Encounter:  07/29/23   Assessment:   Seasonal and perennial allergic rhinitis (trees, weeds, grasses, indoor molds, dust mites, cat, dog and cockroach) - was briefly on allergen immunotherapy    Adverse food reaction (seafood) - with negative skin testing to seafood and now tolerates   Shortness of breath - diagnosed as neurogenic cough   Chronic urticaria - well controlled with Xolair monthly    Plan/Recommendations:   1. Seasonal and perennial allergic rhinitis - Stop the carbinoxamine since you did not notice an improvement.  - Stop the ipratropium nasal spray and start Ryaltris two sprays per nostril twice daily.  - Sample of Ryaltris provided.  - Use DAILY to see if this can dry you up.  - We will send you to see Dr. Marene Lenz at [facility] - Last sinus CT (2022):   - Consider allergy shots (you will be at maintenance in 4 months or so).   2. Chronic autoimmune urticaria  - Continue with Xolair monthly as you are doing.   3. GERD - Discontinue omeprazole and famotidine. - Start Protonix 40mg  twice daily instead.  - Definitely see GI again for an endoscopy.   4. Return in about 6 months (around 01/26/2024).   Subjective:   Emma Larsen is a 52 y.o. female presenting today for follow up of  Chief Complaint  Patient presents with   Follow-up   Cough    Emma Larsen has a history of the following: Patient Active Problem List   Diagnosis Date Noted   Impacted cerumen of both ears 04/30/2021   Tendonitis of wrist, right 03/07/2021   Constipation 12/02/2019   Allergy to environmental factors 12/02/2019   Ear itching 11/09/2019   Globus sensation 11/09/2019   Schatzki's ring 10/19/2019   Chronic cough 09/08/2019   Dysphonia 09/08/2019   Laryngospasm 09/08/2019   Sinusitis, acute 07/08/2018   Simple chronic bronchitis (HCC) 06/03/2018   Seasonal and perennial allergic rhinitis 02/19/2018   SOB (shortness of breath)  02/16/2018   Adverse food reaction 02/16/2018   Chronic rhinitis 02/16/2018   Gastroesophageal reflux disease 02/16/2018    History obtained from: chart review and patient.  Emma Larsen is a 52 y.o. female presenting for a follow up visit.  She was last seen in January 2024.  At that time, we started ipratropium 1 spray per nostril 3 times daily as well as carbinoxamine 4 mg twice daily.  She also continue with Xolair for her chronic autoimmune urticaria.  For her GERD, we increased her omeprazole to 40 mg twice daily and her famotidine 40 mg twice daily.  She is having a lot of stress with her divorce.   Allergic Rhinitis Symptom History: She remains on the carbinoxamine and ipratropium.  This never really seem to make a difference.  She has been on allergy shots, and she is willing to do them again if we think that it could be helpful.  She does not remember if she had relief last time, but I do not think that she continued with them consistently.  She is more than willing to be compliant with them at this point.  She does note that when she went out to [place], all of her symptoms disappeared, so she thinks a lot of this is related to allergies.  Skin Symptom History: Her urticaria are under good control with the Xolair monthly.  She is not having any breakthrough symptoms.  GERD Symptom History: She remains on omeprazole  and famotidine.  She has been very compliant with this.  However, she still has some breakthrough symptoms.  She has never been on Protonix to her knowledge.  She is now working for [employer]. 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  Otherwise, there have been no changes to her past medical history, surgical history, family history, or social history.    Review of systems otherwise negative other than that mentioned in the HPI.    Objective:   Blood pressure (!) 140/100, pulse 88, temperature 98.2 F (36.8 C), height 5\' 2"  (1.575 m), weight 152 lb 3.2 oz (69  kg), SpO2 99%. Body mass index is 27.84 kg/m.    Physical Exam Vitals reviewed.  Constitutional:      Appearance: She is well-developed.     Comments: Talkative.  HENT:     Head: Normocephalic and atraumatic.     Right Ear: Tympanic membrane, ear canal and external ear normal.     Left Ear: Tympanic membrane, ear canal and external ear normal.     Nose: No nasal deformity, septal deviation, mucosal edema or rhinorrhea.     Right Turbinates: Enlarged, swollen and pale.     Left Turbinates: Enlarged, swollen and pale.     Right Sinus: No maxillary sinus tenderness or frontal sinus tenderness.     Left Sinus: No maxillary sinus tenderness or frontal sinus tenderness.     Mouth/Throat:     Mouth: Mucous membranes are not pale and not dry.     Pharynx: Uvula midline.     Comments: Cobblestoning in the posterior oropharynx. Eyes:     General: Lids are normal. No allergic shiner.       Right eye: No discharge.        Left eye: No discharge.     Conjunctiva/sclera: Conjunctivae normal.     Right eye: Right conjunctiva is not injected. No chemosis.    Left eye: Left conjunctiva is not injected. No chemosis.    Pupils: Pupils are equal, round, and reactive to light.  Cardiovascular:     Rate and Rhythm: Normal rate and regular rhythm.     Heart sounds: Normal heart sounds.  Pulmonary:     Effort: Pulmonary effort is normal. No tachypnea, accessory muscle usage or respiratory distress.     Breath sounds: Normal breath sounds. No wheezing, rhonchi or rales.     Comments: Moving air well in all lung fields. No increased work of breathing noted.  Chest:     Chest wall: No tenderness.  Lymphadenopathy:     Cervical: No cervical adenopathy.  Skin:    General: Skin is warm.     Capillary Refill: Capillary refill takes less than 2 seconds.     Coloration: Skin is not pale.     Findings: No abrasion, erythema, petechiae or rash. Rash is not papular, urticarial or vesicular.     Comments:  No urticaria noted today.   Neurological:     Mental Status: She is alert.  Psychiatric:        Behavior: Behavior is cooperative.      Diagnostic studies: none       Malachi Bonds, MD  [facility]

## 2023-07-29 NOTE — Patient Instructions (Addendum)
1. Seasonal and perennial allergic rhinitis - Stop the carbinoxamine since you did not notice an improvement.  - Stop the ipratropium nasal spray and start Ryaltris two sprays per nostril twice daily.  - Sample of Ryaltris provided.  - Use DAILY to see if this can dry you up.  - We will send you to see Dr. Marene Lenz at [facility] - Last sinus CT (2022):   - Consider allergy shots (you will be at maintenance in 4 months or so).   2. Chronic autoimmune urticaria  - Continue with Xolair monthly as you are doing.   3. GERD - Discontinue omeprazole and famotidine. - Start Protonix 40mg  twice daily instead.  - Definitely see GI again for an endoscopy.   4. Return in about 6 months (around 01/26/2024).    Please inform us of any Emergency Department visits, hospitalizations, or changes in symptoms. Call us before going to the ED for breathing or allergy symptoms since we might be able to fit you in for a sick visit. Feel free to contact us anytime with any questions, problems, or concerns.  It was a pleasure to see you today!  Websites that have reliable patient information: 1. American Academy of Asthma, Allergy, and Immunology: www.aaaai.org 2. Food Allergy Research and Education (FARE): foodallergy.org 3. Mothers of Asthmatics: http://www.asthmacommunitynetwork.org 4. American College of Allergy, Asthma, and Immunology: www.acaai.org   COVID-19 Vaccine Information can be found at: [email or web address] For questions related to vaccine distribution or appointments, please email [email or web address] or call [phone].   We realize that you might be concerned about having an allergic reaction to the COVID19 vaccines. To help with that concern, WE ARE OFFERING THE COVID19 VACCINES IN OUR OFFICE! Ask the front desk for dates!     "Like" [place] on Facebook and Instagram for our latest updates!      A healthy democracy works  best when [employer] participate! Make sure you are registered to vote! If you have moved or changed any of your contact information, you will need to get this updated before voting!  In some cases, you MAY be able to register to vote online: [email or web address]     Allergy Shots  Allergies are the result of a chain reaction that starts in the immune system. Your immune system controls how your body defends itself. For instance, if you have an allergy to pollen, your immune system identifies pollen as an invader or allergen. Your immune system overreacts by producing antibodies called Immunoglobulin E (IgE). These antibodies travel to cells that release chemicals, causing an allergic reaction.  The concept behind allergy immunotherapy, whether it is received in the form of shots or tablets, is that the immune system can be desensitized to specific allergens that trigger allergy symptoms. Although it requires time and patience, the payback can be long-term relief. Allergy injections contain a dilute solution of those substances that you are allergic to based upon your skin testing and allergy history.   How Do Allergy Shots Work?  Allergy shots work much like a vaccine. Your body responds to injected amounts of a particular allergen given in increasing doses, eventually developing a resistance and tolerance to it. Allergy shots can lead to decreased, minimal or no allergy symptoms.  There generally are two phases: build-up and maintenance. Build-up often ranges from three to six months and involves receiving injections with increasing amounts of the allergens. The shots are typically given once or twice a week, though more rapid build-up schedules are sometimes used.  The maintenance phase begins when the most effective dose is reached. This dose is different for each person, depending on how allergic you are and your response to the build-up injections. Once the  maintenance dose is reached, there are longer periods between injections, typically two to four weeks.  Occasionally doctors give cortisone-type shots that can temporarily reduce allergy symptoms. These types of shots are different and should not be confused with allergy immunotherapy shots.  Who Can Be Treated with Allergy Shots?  Allergy shots may be a good treatment approach for people with allergic rhinitis (hay fever), allergic asthma, conjunctivitis (eye allergy) or stinging insect allergy.   Before deciding to begin allergy shots, you should consider:   The length of allergy season and the severity of your symptoms  Whether medications and/or changes to your environment can control your symptoms  Your desire to avoid long-term medication use  Time: allergy immunotherapy requires a major time commitment  Cost: may vary depending on your insurance coverage  Allergy shots for children age 95 and older are effective and often well tolerated. They might prevent the onset of new allergen sensitivities or the progression to asthma.  Allergy shots are not started on patients who are pregnant but can be continued on patients who become pregnant while receiving them. In some patients with other medical conditions or who take certain common medications, allergy shots may be of risk. It is important to mention other medications you talk to your allergist.   What are the two types of build-ups offered:   RUSH or Rapid Desensitization -- one day of injections lasting from 8:30-4:30pm, injections every 1 hour.  Approximately half of the build-up process is completed in that one day.  The following week, normal build-up is resumed, and this entails ~16 visits either weekly or twice weekly, until reaching your "maintenance dose" which is continued weekly until eventually getting spaced out to every month for a duration of 3 to 5 years. The regular build-up appointments are nurse visits where the  injections are administered, followed by required monitoring for 30 minutes.    Traditional build-up -- weekly visits for 6 -12 months until reaching "maintenance dose", then continue weekly until eventually spacing out to every 4 weeks as above. At these appointments, the injections are administered, followed by required monitoring for 30 minutes.     Either way is acceptable, and both are equally effective. With the rush protocol, the advantage is that less time is spent here for injections overall AND you would also reach maintenance dosing faster (which is when the clinical benefit starts to become more apparent). Not everyone is a candidate for rapid desensitization.   IF we proceed with the RUSH protocol, there are premedications which must be taken the day before and the day after the rush only (this includes antihistamines, steroids, and Singulair).  After the rush day, no prednisone or Singulair is required, and we just recommend antihistamines taken on your injection day.  What Is An Estimate of the Costs?  If you are interested in starting allergy injections, please check with your insurance company about your coverage for both allergy vial sets and allergy injections.  Please do so prior to making the appointment to start injections.  The following are CPT codes to give to your insurance company. These are the amounts we BILL to the insurance company, but the amount YOU WILL PAY and WE RECEIVE IS SUBSTANTIALLY LESS and depends on the contracts we have with different insurance  companies.   Amount Billed to Insurance One allergy vial set  CPT 95165   $ 1200     Two allergy vial set  CPT 95165   $ 2400     Three allergy vial set  CPT 95165   $ 3600     One injection   CPT 95115   $ 35  Two injections   CPT 95117   $ 40 RUSH (Rapid Desensitization) CPT 95180 x 8 hours $500/hour  Regarding the allergy injections, your co-pay may or may not apply with each injection, so please confirm this  with your insurance company. When you start allergy injections, 1 or 2 sets of vials are made based on your allergies.  Not all patients can be on one set of vials. A set of vials lasts 6 months to a year depending on how quickly you can proceed with your build-up of your allergy injections. Vials are personalized for each patient depending on their specific allergens.  How often are allergy injection given during the build-up period?   Injections are given at least weekly during the build-up period until your maintenance dose is achieved. Per the doctor's discretion, you may have the option of getting allergy injections two times per week during the build-up period. However, there must be at least 48 hours between injections. The build-up period is usually completed within 6-12 months depending on your ability to schedule injections and for adjustments for reactions. When maintenance dose is reached, your injection schedule is gradually changed to every two weeks and later to every three weeks. Injections will then continue every 4 weeks. Usually, injections are continued for a total of 3-5 years.   When Will I Feel Better?  Some may experience decreased allergy symptoms during the build-up phase. For others, it may take as long as 12 months on the maintenance dose. If there is no improvement after a year of maintenance, your allergist will discuss other treatment options with you.  If you aren't responding to allergy shots, it may be because there is not enough dose of the allergen in your vaccine or there are missing allergens that were not identified during your allergy testing. Other reasons could be that there are high levels of the allergen in your environment or major exposure to non-allergic triggers like tobacco smoke.  What Is the Length of Treatment?  Once the maintenance dose is reached, allergy shots are generally continued for three to five years. The decision to stop should be discussed  with your allergist at that time. Some people may experience a permanent reduction of allergy symptoms. Others may relapse and a longer course of allergy shots can be considered.  What Are the Possible Reactions?  The two types of adverse reactions that can occur with allergy shots are local and systemic. Common local reactions include very mild redness and swelling at the injection site, which can happen immediately or several hours after. Report a delayed reaction from your last injection. These include arm swelling or runny nose, watery eyes or cough that occurs within 12-24 hours after injection. A systemic reaction, which is less common, affects the entire body or a particular body system. They are usually mild and typically respond quickly to medications. Signs include increased allergy symptoms such as sneezing, a stuffy nose or hives.   Rarely, a serious systemic reaction called anaphylaxis can develop. Symptoms include swelling in the throat, wheezing, a feeling of tightness in the chest, nausea or dizziness. Most serious systemic  reactions develop within 30 minutes of allergy shots. This is why it is strongly recommended you wait in your doctor's office for 30 minutes after your injections. Your allergist is trained to watch for reactions, and his or her staff is trained and equipped with the proper medications to identify and treat them.   Report to the nurse immediately if you experience any of the following symptoms: swelling, itching or redness of the skin, hives, watery eyes/nose, breathing difficulty, excessive sneezing, coughing, stomach pain, diarrhea, or light headedness. These symptoms may occur within 15-20 minutes after injection and may require medication.   Who Should Administer Allergy Shots?  The preferred location for receiving shots is your prescribing allergist's office. Injections can sometimes be given at another facility where the physician and staff are trained to  recognize and treat reactions, and have received instructions by your prescribing allergist.  What if I am late for an injection?   Injection dose will be adjusted depending upon how many days or weeks you are late for your injection.   What if I am sick?   Please report any illness to the nurse before receiving injections. She may adjust your dose or postpone injections depending on your symptoms. If you have fever, flu, sinus infection or chest congestion it is best to postpone allergy injections until you are better. Never get an allergy injection if your asthma is causing you problems. If your symptoms persist, seek out medical care to get your health problem under control.  What If I am or Become Pregnant:  Women that become pregnant should schedule an appointment with [facility] before receiving any further allergy injections.

## 2023-07-30 ENCOUNTER — Encounter: Payer: Self-pay | Admitting: Allergy & Immunology

## 2023-08-15 ENCOUNTER — Other Ambulatory Visit (HOSPITAL_COMMUNITY): Payer: Self-pay

## 2023-08-15 DIAGNOSIS — S6432XD Injury of digital nerve of left thumb, subsequent encounter: Secondary | ICD-10-CM | POA: Diagnosis not present

## 2023-08-15 DIAGNOSIS — M5412 Radiculopathy, cervical region: Secondary | ICD-10-CM | POA: Diagnosis not present

## 2023-08-15 MED ORDER — INFLUENZA VIRUS VACC SPLIT PF (FLUZONE) 0.5 ML IM SUSY
0.5000 mL | PREFILLED_SYRINGE | Freq: Once | INTRAMUSCULAR | 0 refills | Status: AC
  Filled 2023-08-15: qty 0.5, 1d supply, fill #0

## 2023-08-19 ENCOUNTER — Ambulatory Visit: Payer: Medicaid Other

## 2023-08-20 DIAGNOSIS — S6432XD Injury of digital nerve of left thumb, subsequent encounter: Secondary | ICD-10-CM | POA: Diagnosis not present

## 2023-08-20 DIAGNOSIS — M5412 Radiculopathy, cervical region: Secondary | ICD-10-CM | POA: Diagnosis not present

## 2023-08-21 ENCOUNTER — Ambulatory Visit (INDEPENDENT_AMBULATORY_CARE_PROVIDER_SITE_OTHER): Payer: Commercial Managed Care - PPO

## 2023-08-21 DIAGNOSIS — L501 Idiopathic urticaria: Secondary | ICD-10-CM | POA: Diagnosis not present

## 2023-08-27 DIAGNOSIS — S6432XD Injury of digital nerve of left thumb, subsequent encounter: Secondary | ICD-10-CM | POA: Diagnosis not present

## 2023-08-27 DIAGNOSIS — M5412 Radiculopathy, cervical region: Secondary | ICD-10-CM | POA: Diagnosis not present

## 2023-09-05 DIAGNOSIS — M5412 Radiculopathy, cervical region: Secondary | ICD-10-CM | POA: Diagnosis not present

## 2023-09-10 DIAGNOSIS — S6432XD Injury of digital nerve of left thumb, subsequent encounter: Secondary | ICD-10-CM | POA: Diagnosis not present

## 2023-09-10 DIAGNOSIS — M5412 Radiculopathy, cervical region: Secondary | ICD-10-CM | POA: Diagnosis not present

## 2023-09-17 DIAGNOSIS — F4320 Adjustment disorder, unspecified: Secondary | ICD-10-CM | POA: Diagnosis not present

## 2023-09-18 ENCOUNTER — Ambulatory Visit: Payer: Commercial Managed Care - PPO

## 2023-09-18 DIAGNOSIS — L501 Idiopathic urticaria: Secondary | ICD-10-CM

## 2023-09-18 MED ORDER — EPINEPHRINE 0.3 MG/0.3ML IJ SOAJ
INTRAMUSCULAR | 1 refills | Status: DC
  Filled 2024-07-24: qty 2, 30d supply, fill #0

## 2023-10-01 DIAGNOSIS — S6432XD Injury of digital nerve of left thumb, subsequent encounter: Secondary | ICD-10-CM | POA: Diagnosis not present

## 2023-10-01 DIAGNOSIS — M5412 Radiculopathy, cervical region: Secondary | ICD-10-CM | POA: Diagnosis not present

## 2023-10-16 ENCOUNTER — Ambulatory Visit: Payer: Commercial Managed Care - PPO

## 2023-10-16 ENCOUNTER — Telehealth: Payer: Self-pay | Admitting: *Deleted

## 2023-10-16 NOTE — Telephone Encounter (Signed)
Patient was on the schedule to receive her Xolair today but was not here, spoke with Tammy and she stated that the patient has new coverage so she has to do a new Prior Auth and will call the patient with approval. Patient called early to warm up her medication and was advised that her medication was not here and I would call her back with an update. I called and left a voicemail asking for the patient to return call to advise.

## 2023-10-17 NOTE — Telephone Encounter (Signed)
Patient called back and was informed. Patient verbalized understanding.  °

## 2023-10-20 ENCOUNTER — Other Ambulatory Visit: Payer: Self-pay

## 2023-10-20 ENCOUNTER — Telehealth: Payer: Self-pay | Admitting: *Deleted

## 2023-10-20 ENCOUNTER — Encounter (HOSPITAL_COMMUNITY): Payer: Self-pay

## 2023-10-20 MED ORDER — OMALIZUMAB 150 MG/ML ~~LOC~~ SOSY
300.0000 mg | PREFILLED_SYRINGE | SUBCUTANEOUS | 11 refills | Status: DC
Start: 2023-10-20 — End: 2023-10-22
  Filled 2023-10-20: qty 2, 28d supply, fill #0

## 2023-10-20 NOTE — Telephone Encounter (Signed)
Called patient and advised approval with new Ins and submit to [facility] to deliver to clinic

## 2023-10-22 ENCOUNTER — Other Ambulatory Visit: Payer: Self-pay | Admitting: Pharmacist

## 2023-10-22 ENCOUNTER — Other Ambulatory Visit: Payer: Self-pay

## 2023-10-22 ENCOUNTER — Ambulatory Visit: Payer: Commercial Managed Care - PPO | Attending: Family | Admitting: Pharmacist

## 2023-10-22 ENCOUNTER — Other Ambulatory Visit (HOSPITAL_COMMUNITY): Payer: Self-pay

## 2023-10-22 ENCOUNTER — Encounter (HOSPITAL_COMMUNITY): Payer: Self-pay

## 2023-10-22 DIAGNOSIS — M5412 Radiculopathy, cervical region: Secondary | ICD-10-CM | POA: Diagnosis not present

## 2023-10-22 DIAGNOSIS — Z7189 Other specified counseling: Secondary | ICD-10-CM

## 2023-10-22 DIAGNOSIS — S6432XD Injury of digital nerve of left thumb, subsequent encounter: Secondary | ICD-10-CM | POA: Diagnosis not present

## 2023-10-22 MED ORDER — OMALIZUMAB 150 MG/ML ~~LOC~~ SOSY
300.0000 mg | PREFILLED_SYRINGE | SUBCUTANEOUS | 11 refills | Status: DC
  Filled 2023-10-22 (×2): qty 2, 28d supply, fill #0
  Filled 2023-11-17: qty 2, 28d supply, fill #1
  Filled 2023-12-11: qty 2, 28d supply, fill #2
  Filled 2024-01-20: qty 2, 28d supply, fill #3
  Filled 2024-03-01: qty 2, 28d supply, fill #4

## 2023-10-22 NOTE — Progress Notes (Signed)
   S: Patient presents for review of their specialty medication therapy.  Patient is currently taking Xolair for urticaria. Patient is managed by Dr. Dellis Anes for this.   Adherence: confirms  Efficacy: reports the medication works well for her.   Dosing: Give subcutaneously.  Can be dosed every 2 or 4 weeks based on baseline serum IgE levels and body weight.  Chronic idiopathic urticaria: SubQ: 150 or 300 mg every 4 weeks. Dosing is not dependent on serum IgE (free or total) level or body weight.    Dose adjustments: Renal: no dose adjustments  Hepatic: no dose adjustments  Toxicity: Severe hypersensitivity reaction or anaphylaxis: Discontinue treatment. Fever, arthralgia, and rash: Discontinue treatment if this constellation of symptoms occurs.  Drug-drug interactions: none   Monitoring: CV effects: none Eosinophilia and vasculitis: none Fever/arthralgia/rash: none Hypersensitivity/Anaphylaxis: none Malignant neoplasms: none Pulmonary function tests (for asthma): none   O:     Lab Results  Component Value Date   WBC 8.8 02/18/2023   HGB 15.3 (H) 02/18/2023   HCT 46.3 (H) 02/18/2023   MCV 88.4 02/18/2023   PLT 239 02/18/2023      Chemistry      Component Value Date/Time   NA 140 02/18/2023 2112   NA 142 04/12/2021 1649   K 3.5 02/18/2023 2112   CL 104 02/18/2023 2112   CO2 26 02/18/2023 2112   BUN 14 02/18/2023 2112   BUN 9 04/12/2021 1649   CREATININE 0.95 02/18/2023 2112      Component Value Date/Time   CALCIUM 9.9 02/18/2023 2112   ALKPHOS 65 02/18/2023 2112   AST 21 02/18/2023 2112   ALT 17 02/18/2023 2112   BILITOT 0.6 02/18/2023 2112   BILITOT <0.2 04/12/2021 1649       A/P: 1. Medication review: patient currently on Xolair for urticaria. Reviewed the medication with the patient, including the following: Xolair, omalizumab, is a novel IgE blocker.  It appears to reduce rates of hospitalizations, ER visits and unscheduled physician visits due  asthma exacerbations when added to standard therapy.  Studies also show a reduction in steroid requirements and improvement in quality of life.  Patient educated on purpose, proper use and potential adverse effects of Xolair.  Following instruction patient verbalized understanding. Patient should always have an EpiPen readily available in the event of anaphylaxis. SubQ: For SubQ injection only; doses >150 mg should be divided over more than one injection site (eg, 225 mg or 300 mg administered as two injections, 375 mg administered as three injections); each injection site should be separated by >=1 inch. Do not inject into moles, scars, bruises, tender areas, or broken skin. Injections may take 5 to 10 seconds to administer (solution is slightly viscous). Administer only under direct medical supervision and observe patient for 2 hours after the first 3 injections and 30 minutes after subsequent injections Angela Nevin 2015) or in accordance with individual institution policies and procedures.No recommendations for any changes at this time.   Butch Penny, PharmD, P[place]y [place], CPP Clinical Pharmacist [facility] [phone]

## 2023-10-22 NOTE — Progress Notes (Signed)
Specialty Pharmacy Initial Fill Coordination Note  Emma Larsen is a 52 y.o. female contacted today regarding initial fill of specialty medication(s) Omalizumab   Patient requested Delivery   Delivery date: 10/23/23   [facility] [address] [place] [address]   Medication will be filled on 12/11.   Patient is aware of $0 copayment.

## 2023-10-22 NOTE — Progress Notes (Signed)
Please see OV from 10/22/23 for full documentation.   Butch Penny, PharmD, P[place]y [place], CPP Clinical Pharmacist [facility] [phone]

## 2023-10-23 DIAGNOSIS — F4320 Adjustment disorder, unspecified: Secondary | ICD-10-CM | POA: Diagnosis not present

## 2023-10-23 NOTE — Telephone Encounter (Signed)
Tried to reach patient X2  to advise delivery of Xolair but unable to leave message

## 2023-10-28 ENCOUNTER — Other Ambulatory Visit: Payer: Self-pay | Admitting: *Deleted

## 2023-10-28 ENCOUNTER — Ambulatory Visit: Payer: Commercial Managed Care - PPO | Admitting: *Deleted

## 2023-10-28 DIAGNOSIS — L501 Idiopathic urticaria: Secondary | ICD-10-CM

## 2023-10-28 MED ORDER — EPINEPHRINE 0.3 MG/0.3ML IJ SOAJ: 0.3000 mg | INTRAMUSCULAR | 1 refills | Status: DC | PRN

## 2023-11-11 ENCOUNTER — Other Ambulatory Visit: Payer: Self-pay

## 2023-11-13 ENCOUNTER — Other Ambulatory Visit (HOSPITAL_COMMUNITY): Payer: Self-pay

## 2023-11-13 ENCOUNTER — Other Ambulatory Visit: Payer: Self-pay

## 2023-11-17 ENCOUNTER — Other Ambulatory Visit: Payer: Self-pay

## 2023-11-17 DIAGNOSIS — M65312 Trigger thumb, left thumb: Secondary | ICD-10-CM | POA: Diagnosis not present

## 2023-11-18 ENCOUNTER — Other Ambulatory Visit: Payer: Self-pay

## 2023-11-19 ENCOUNTER — Other Ambulatory Visit: Payer: Self-pay

## 2023-11-19 NOTE — Progress Notes (Signed)
 Specialty Pharmacy Refill Coordination Note  Emma Larsen is a 53 y.o. female contacted today regarding refills of specialty medication(s) Xolair  150MG /ML prefilled syringe.  Patient requested Courier to MD Office  Delivery date: 11/19/23  Verified [address] [address] [place] [address]  Medication will be filled on 11/18/23.    *Spoke with patient and is aware of adjusted delivery date. Confirmed next appointment as well.*

## 2023-11-20 ENCOUNTER — Other Ambulatory Visit (HOSPITAL_COMMUNITY): Payer: Self-pay

## 2023-11-25 ENCOUNTER — Ambulatory Visit: Payer: Commercial Managed Care - PPO

## 2023-11-25 DIAGNOSIS — F4320 Adjustment disorder, unspecified: Secondary | ICD-10-CM | POA: Diagnosis not present

## 2023-12-01 ENCOUNTER — Other Ambulatory Visit: Payer: Self-pay

## 2023-12-01 ENCOUNTER — Encounter (HOSPITAL_BASED_OUTPATIENT_CLINIC_OR_DEPARTMENT_OTHER): Payer: Self-pay | Admitting: Orthopedic Surgery
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
## 2023-12-02 ENCOUNTER — Ambulatory Visit: Payer: Commercial Managed Care - PPO | Admitting: *Deleted

## 2023-12-02 DIAGNOSIS — L501 Idiopathic urticaria: Secondary | ICD-10-CM | POA: Diagnosis not present

## 2023-12-02 NOTE — Progress Notes (Signed)

## 2023-12-05 ENCOUNTER — Other Ambulatory Visit: Payer: Self-pay | Admitting: Allergy & Immunology

## 2023-12-09 ENCOUNTER — Encounter (HOSPITAL_BASED_OUTPATIENT_CLINIC_OR_DEPARTMENT_OTHER): Payer: Self-pay | Admitting: Orthopedic Surgery

## 2023-12-09 ENCOUNTER — Ambulatory Visit (HOSPITAL_BASED_OUTPATIENT_CLINIC_OR_DEPARTMENT_OTHER): Payer: Self-pay | Admitting: Anesthesiology

## 2023-12-09 ENCOUNTER — Encounter (HOSPITAL_BASED_OUTPATIENT_CLINIC_OR_DEPARTMENT_OTHER)
Admission: RE | Disposition: A | Discharge status: Home / Self Care | Payer: Self-pay | Source: Home / Self Care | Attending: Orthopedic Surgery

## 2023-12-09 ENCOUNTER — Ambulatory Visit (HOSPITAL_BASED_OUTPATIENT_CLINIC_OR_DEPARTMENT_OTHER)
Admission: RE | Admit: 2023-12-09 | Discharge: 2023-12-09 | Disposition: A | Discharge status: Home / Self Care | Payer: Commercial Managed Care - PPO | Attending: Orthopedic Surgery | Admitting: Orthopedic Surgery

## 2023-12-09 ENCOUNTER — Ambulatory Visit (HOSPITAL_BASED_OUTPATIENT_CLINIC_OR_DEPARTMENT_OTHER): Payer: Commercial Managed Care - PPO | Admitting: Anesthesiology

## 2023-12-09 ENCOUNTER — Other Ambulatory Visit: Payer: Self-pay

## 2023-12-09 DIAGNOSIS — M65312 Trigger thumb, left thumb: Secondary | ICD-10-CM | POA: Diagnosis not present

## 2023-12-09 HISTORY — PX: TRIGGER FINGER RELEASE: SHX641

## 2023-12-09 SURGERY — RELEASE, A1 PULLEY, FOR TRIGGER FINGER
Anesthesia: Monitor Anesthesia Care | Laterality: Left

## 2023-12-09 MED ORDER — OXYCODONE HCL 5 MG/5ML PO SOLN: 5.0000 mg | Freq: Once | ORAL | Status: DC | PRN

## 2023-12-09 MED ORDER — FENTANYL CITRATE (PF) 100 MCG/2ML IJ SOLN
INTRAMUSCULAR | Status: AC
  Filled 2023-12-09: qty 2

## 2023-12-09 MED ORDER — CEFAZOLIN SODIUM-DEXTROSE 2-4 GM/100ML-% IV SOLN
2.0000 g | INTRAVENOUS | Status: AC
  Administered 2023-12-09: 2 g via INTRAVENOUS

## 2023-12-09 MED ORDER — MELOXICAM 15 MG PO TABS: 15.0000 mg | ORAL_TABLET | Freq: Every day | ORAL | 0 refills | Status: DC | PRN

## 2023-12-09 MED ORDER — FENTANYL CITRATE (PF) 100 MCG/2ML IJ SOLN
INTRAMUSCULAR | Status: DC | PRN
  Administered 2023-12-09 (×2): 50 ug via INTRAVENOUS

## 2023-12-09 MED ORDER — MIDAZOLAM HCL 2 MG/2ML IJ SOLN
INTRAMUSCULAR | Status: AC
  Filled 2023-12-09: qty 2

## 2023-12-09 MED ORDER — SODIUM CHLORIDE 0.9 % IV SOLN: INTRAVENOUS | Status: DC | PRN

## 2023-12-09 MED ORDER — PROPOFOL 10 MG/ML IV BOLUS
INTRAVENOUS | Status: DC | PRN
  Administered 2023-12-09: 20 mg via INTRAVENOUS

## 2023-12-09 MED ORDER — ACETAMINOPHEN 500 MG PO TABS
ORAL_TABLET | ORAL | Status: AC
  Filled 2023-12-09: qty 2

## 2023-12-09 MED ORDER — LIDOCAINE HCL 1 % IJ SOLN
INTRAMUSCULAR | Status: DC | PRN
  Administered 2023-12-09: 5 mL

## 2023-12-09 MED ORDER — FENTANYL CITRATE (PF) 100 MCG/2ML IJ SOLN: 25.0000 ug | INTRAMUSCULAR | Status: DC | PRN

## 2023-12-09 MED ORDER — ONDANSETRON 4 MG PO TBDP: 4.0000 mg | ORAL_TABLET | Freq: Three times a day (TID) | ORAL | 0 refills | Status: DC | PRN

## 2023-12-09 MED ORDER — POVIDONE-IODINE 10 % EX SWAB
2.0000 | Freq: Once | CUTANEOUS | Status: AC
  Administered 2023-12-09: 2 via TOPICAL

## 2023-12-09 MED ORDER — TRAMADOL HCL 50 MG PO TABS: 50.0000 mg | ORAL_TABLET | Freq: Two times a day (BID) | ORAL | 0 refills | Status: DC | PRN

## 2023-12-09 MED ORDER — LACTATED RINGERS IV SOLN: INTRAVENOUS | Status: DC

## 2023-12-09 MED ORDER — PROPOFOL 500 MG/50ML IV EMUL
INTRAVENOUS | Status: DC | PRN
  Administered 2023-12-09: 100 ug/kg/min via INTRAVENOUS

## 2023-12-09 MED ORDER — AMISULPRIDE (ANTIEMETIC) 5 MG/2ML IV SOLN: 10.0000 mg | Freq: Once | INTRAVENOUS | Status: DC | PRN

## 2023-12-09 MED ORDER — LIDOCAINE HCL (PF) 1 % IJ SOLN
INTRAMUSCULAR | Status: AC
  Filled 2023-12-09: qty 30

## 2023-12-09 MED ORDER — DEXAMETHASONE SODIUM PHOSPHATE 10 MG/ML IJ SOLN: 8.0000 mg | Freq: Once | INTRAMUSCULAR | Status: DC

## 2023-12-09 MED ORDER — OXYCODONE HCL 5 MG PO TABS: 5.0000 mg | ORAL_TABLET | Freq: Once | ORAL | Status: DC | PRN

## 2023-12-09 MED ORDER — ACETAMINOPHEN 500 MG PO TABS
1000.0000 mg | ORAL_TABLET | Freq: Once | ORAL | Status: AC
  Administered 2023-12-09: 1000 mg via ORAL

## 2023-12-09 MED ORDER — LIDOCAINE 2% (20 MG/ML) 5 ML SYRINGE
INTRAMUSCULAR | Status: DC | PRN
  Administered 2023-12-09: 60 mg via INTRAVENOUS

## 2023-12-09 MED ORDER — ACETAMINOPHEN 500 MG PO TABS: 1000.0000 mg | ORAL_TABLET | Freq: Once | ORAL | Status: AC

## 2023-12-09 MED ORDER — MIDAZOLAM HCL 5 MG/5ML IJ SOLN
INTRAMUSCULAR | Status: DC | PRN
  Administered 2023-12-09: 2 mg via INTRAVENOUS

## 2023-12-09 MED ORDER — BUPIVACAINE HCL (PF) 0.25 % IJ SOLN
INTRAMUSCULAR | Status: DC | PRN
  Administered 2023-12-09: 5 mL via PERINEURAL

## 2023-12-09 MED ORDER — CEFAZOLIN SODIUM-DEXTROSE 2-4 GM/100ML-% IV SOLN
INTRAVENOUS | Status: AC
  Filled 2023-12-09: qty 100

## 2023-12-09 SURGICAL SUPPLY — 35 items
BLADE SURG 11 STRL SS (BLADE) IMPLANT
BLADE SURG 15 STRL LF DISP TIS (BLADE) ×1 IMPLANT
BNDG COHESIVE 3X5 TAN ST LF (GAUZE/BANDAGES/DRESSINGS) IMPLANT
BNDG ELASTIC 3INX 5YD STR LF (GAUZE/BANDAGES/DRESSINGS) ×1 IMPLANT
BNDG ESMARK 4X9 LF (GAUZE/BANDAGES/DRESSINGS) IMPLANT
BNDG GAUZE DERMACEA FLUFF 4 (GAUZE/BANDAGES/DRESSINGS) ×1 IMPLANT
CHLORAPREP W/TINT 26 (MISCELLANEOUS) ×1 IMPLANT
CORD BIPOLAR FORCEPS 12FT (ELECTRODE) IMPLANT
COVER BACK TABLE 60X90IN (DRAPES) ×1 IMPLANT
CUFF TOURN SGL QUICK 18X4 (TOURNIQUET CUFF) ×1 IMPLANT
DRAPE EXTREMITY T 121X128X90 (DISPOSABLE) ×1 IMPLANT
DRAPE IMP U-DRAPE 54X76 (DRAPES) ×1 IMPLANT
DRAPE SURG 17X23 STRL (DRAPES) ×1 IMPLANT
DRSG EMULSION OIL 3X3 NADH (GAUZE/BANDAGES/DRESSINGS) ×1 IMPLANT
GAUZE PAD ABD 8X10 STRL (GAUZE/BANDAGES/DRESSINGS) IMPLANT
GAUZE SPONGE 4X4 12PLY STRL (GAUZE/BANDAGES/DRESSINGS) ×1 IMPLANT
GLOVE BIO SURGEON STRL SZ7.5 (GLOVE) ×1 IMPLANT
GLOVE BIOGEL PI IND STRL 7.5 (GLOVE) ×1 IMPLANT
GLOVE BIOGEL PI IND STRL 8 (GLOVE) ×1 IMPLANT
GLOVE SURG SYN 7.5 E (GLOVE) ×1
GLOVE SURG SYN 7.5 PF PI (GLOVE) ×1 IMPLANT
GOWN STRL REUS W/ TWL LRG LVL3 (GOWN DISPOSABLE) ×2 IMPLANT
GOWN STRL REUS W/ TWL XL LVL3 (GOWN DISPOSABLE) ×1 IMPLANT
NDL HYPO 25X1 1.5 SAFETY (NEEDLE) IMPLANT
NEEDLE HYPO 25X1 1.5 SAFETY (NEEDLE)
NS IRRIG 1000ML POUR BTL (IV SOLUTION) ×1 IMPLANT
PACK BASIN DAY SURGERY FS (CUSTOM PROCEDURE TRAY) ×1 IMPLANT
PAD CAST 3X4 CTTN HI CHSV (CAST SUPPLIES) IMPLANT
PADDING CAST ABS COTTON 3X4 (CAST SUPPLIES) IMPLANT
PADDING CAST ABS COTTON 4X4 ST (CAST SUPPLIES) ×1 IMPLANT
SUT ETHILON 3 0 PS 1 (SUTURE) ×1 IMPLANT
SYR BULB EAR ULCER 3OZ GRN STR (SYRINGE) ×1 IMPLANT
SYR CONTROL 10ML LL (SYRINGE) IMPLANT
TOWEL GREEN STERILE FF (TOWEL DISPOSABLE) ×1 IMPLANT
UNDERPAD 30X36 HEAVY ABSORB (UNDERPADS AND DIAPERS) ×1 IMPLANT

## 2023-12-09 NOTE — Discharge Instructions (Addendum)
POST-OPERATIVE OPIOID TAPER INSTRUCTIONS: It is important to wean off of your opioid medication as soon as possible. If you do not need pain medication after your surgery it is ok to stop day one. Opioids include: Codeine, Hydrocodone(Norco, Vicodin), Oxycodone(Percocet, oxycontin) and hydromorphone amongst others.  Long term and even short term use of opiods can cause: Increased pain response Dependence Constipation Depression Respiratory depression And more.  Withdrawal symptoms can include Flu like symptoms Nausea, vomiting And more Techniques to manage these symptoms Hydrate well Eat regular healthy meals Stay active Use relaxation techniques(deep breathing, meditating, yoga) Do Not substitute Alcohol to help with tapering If you have been on opioids for less than two weeks and do not have pain than it is ok to stop all together.  Plan to wean off of opioids This plan should start within one week post op of your joint replacement. Maintain the same interval or time between taking each dose and first decrease the dose.  Cut the total daily intake of opioids by one tablet each day Next start to increase the time between doses. The last dose that should be eliminated is the evening dose.   Post Anesthesia Home Care Instructions  Activity: Get plenty of rest for the remainder of the day. A responsible individual must stay with you for 24 hours following the procedure.  For the next 24 hours, DO NOT: -Drive a car -Advertising copywriter -Drink alcoholic beverages -Take any medication unless instructed by your physician -Make any legal decisions or sign important papers.  Meals: Start with liquid foods such as gelatin or soup. Progress to regular foods as tolerated. Avoid greasy, spicy, heavy foods. If nausea and/or vomiting occur, drink only clear liquids until the nausea and/or vomiting subsides. Call your physician if vomiting continues.  Special Instructions/Symptoms: Your  throat may feel dry or sore from the anesthesia or the breathing tube placed in your throat during surgery. If this causes discomfort, gargle with warm salt water. The discomfort should disappear within 24 hours.  If you had a scopolamine patch placed behind your ear for the management of post- operative nausea and/or vomiting:  1. The medication in the patch is effective for 72 hours, after which it should be removed.  Wrap patch in a tissue and discard in the trash. Wash hands thoroughly with soap and water. 2. You may remove the patch earlier than 72 hours if you experience unpleasant side effects which may include dry mouth, dizziness or visual disturbances. 3. Avoid touching the patch. Wash your hands with soap and water after contact with the patch.     Next dose of Tyelnol may be taken at 3p

## 2023-12-09 NOTE — Anesthesia Postprocedure Evaluation (Signed)
Anesthesia Post Note  Patient: Emma Larsen  Procedure(s) Performed: RELEASE TRIGGER FINGER/A-1 PULLEY (Left)     Patient location during evaluation: PACU Anesthesia Type: MAC Level of consciousness: awake Pain management: pain level controlled Vital Signs Assessment: post-procedure vital signs reviewed and stable Respiratory status: spontaneous breathing, nonlabored ventilation and respiratory function stable Cardiovascular status: stable and blood pressure returned to baseline Postop Assessment: no apparent nausea or vomiting Anesthetic complications: no   No notable events documented.  Last Vitals:  Vitals:   12/09/23 1015 12/09/23 1030  BP: 130/86 139/85  Pulse: 85 89  Resp: 15 18  Temp:  36.5 C  SpO2: 96% 98%    Last Pain:  Vitals:   12/09/23 1030  TempSrc: Temporal  PainSc: 0-No pain                 Linton Rump

## 2023-12-09 NOTE — Anesthesia Procedure Notes (Signed)
Procedure Name: MAC Date/Time: 12/09/2023 9:30 AM  Performed by: Caren Macadam, CRNAPre-anesthesia Checklist: Patient identified, Emergency Drugs available, Suction available, Patient being monitored and Timeout performed Patient Re-evaluated:Patient Re-evaluated prior to induction Oxygen Delivery Method: Simple face mask Preoxygenation: Pre-oxygenation with 100% oxygen Induction Type: IV induction

## 2023-12-09 NOTE — Op Note (Signed)
12/09/2023  9:45 AM  PATIENT:  Emma Larsen    PRE-OPERATIVE DIAGNOSIS:  LEFT TRIGGER THUMB  POST-OPERATIVE DIAGNOSIS:  Same  PROCEDURE:  RELEASE TRIGGER FINGER/A-1 PULLEY  SURGEON:  Sheral Apley, MD  ASSISTANT: Levester Fresh, PA-C, he was present and scrubbed throughout the case, critical for completion in a timely fashion, and for retraction, instrumentation, and closure.   ANESTHESIA:   mac/block  PREOPERATIVE INDICATIONS:  Emma Larsen is a  53 y.o. female with a diagnosis of LEFT TRIGGER THUMB who failed conservative measures and elected for surgical management.    The risks benefits and alternatives were discussed with the patient preoperatively including but not limited to the risks of infection, bleeding, nerve injury, cardiopulmonary complications, the need for revision surgery, among others, and the patient was willing to proceed.  OPERATIVE IMPLANTS: none  OPERATIVE FINDINGS: tight A1 pulley  BLOOD LOSS: min  COMPLICATIONS: none  TOURNIQUET TIME: 15-67min  OPERATIVE PROCEDURE:  Patient was identified in the preoperative holding area and site was marked by me She was transported to the operating theater and placed on the table in supine position taking care to pad all bony prominences. After a preincinduction time out anesthesia was induced. The left upper extremity was prepped and draped in normal sterile fashion and a pre-incision timeout was performed. She received ancef for preoperative antibiotics.   I performed a regional anesthetic block with lidocaine and Marcaine plain  After prepping and draping I made a incision over the  pulley of the thumb I protected the neurovascular and strep neurovascular structures and retracted both these with blunt retractors  I used spreading dissection and identified the pulley there was clear dimpling of the tendon I released this pulley with a sharp resection clear release was noted smooth tracking of the tendon  visualized  I thoroughly irrigated closed in layers and a sterile dressing was applied  POST OPERATIVE PLAN: Mobilize and aspirin for DVT prophylaxis

## 2023-12-09 NOTE — Transfer of Care (Signed)
Immediate Anesthesia Transfer of Care Note  Patient: Emma Larsen  Procedure(s) Performed: RELEASE TRIGGER FINGER/A-1 PULLEY (Left)  Patient Location: PACU  Anesthesia Type:MAC  Level of Consciousness: awake  Airway & Oxygen Therapy: Patient Spontanous Breathing  Post-op Assessment: Report given to RN and Post -op Vital signs reviewed and stable  Post vital signs: Reviewed and stable  Last Vitals:  Vitals Value Taken Time  BP 128/93 12/09/23 0951  Temp    Pulse 94 12/09/23 0955  Resp 15 12/09/23 0955  SpO2 98 % 12/09/23 0955  Vitals shown include unfiled device data.  Last Pain:  Vitals:   12/09/23 0837  TempSrc: Oral  PainSc: 0-No pain      Patients Stated Pain Goal: 3 (12/09/23 0837)  Complications: No notable events documented.

## 2023-12-09 NOTE — Anesthesia Preprocedure Evaluation (Addendum)
Anesthesia Evaluation  Patient identified by MRN, date of birth, ID band Patient awake    Reviewed: Allergy & Precautions, NPO status , Patient's Chart, lab work & pertinent test results  History of Anesthesia Complications Negative for: history of anesthetic complications  Airway Mallampati: I  TM Distance: >3 FB Neck ROM: Full    Dental  (+) Dental Advisory Given   Pulmonary neg shortness of breath, asthma (patient denies) , neg sleep apnea, neg COPD, neg recent URI   Pulmonary exam normal breath sounds clear to auscultation       Cardiovascular negative cardio ROS  Rhythm:Regular Rate:Normal     Neuro/Psych  Headaches, neg Seizures    GI/Hepatic Neg liver ROS,GERD  Medicated,,Schatzki's ring   Endo/Other  negative endocrine ROS    Renal/GU negative Renal ROS     Musculoskeletal   Abdominal   Peds  Hematology negative hematology ROS (+)   Anesthesia Other Findings   Reproductive/Obstetrics                             Anesthesia Physical Anesthesia Plan  ASA: 2  Anesthesia Plan: MAC   Post-op Pain Management: Tylenol PO (pre-op)*   Induction: Intravenous  PONV Risk Score and Plan: 2 and Ondansetron, Dexamethasone, Treatment may vary due to age or medical condition, Propofol infusion and TIVA  Airway Management Planned: Natural Airway and Simple Face Mask  Additional Equipment:   Intra-op Plan:   Post-operative Plan:   Informed Consent: I have reviewed the patients History and Physical, chart, labs and discussed the procedure including the risks, benefits and alternatives for the proposed anesthesia with the patient or authorized representative who has indicated his/her understanding and acceptance.     Dental advisory given  Plan Discussed with: CRNA and Anesthesiologist  Anesthesia Plan Comments: (Discussed with patient risks of MAC including, but not limited to,  minor pain or discomfort, hearing people in the room, and possible need for backup general anesthesia. Risks for general anesthesia also discussed including, but not limited to, sore throat, hoarse voice, chipped/damaged teeth, injury to vocal cords, nausea and vomiting, allergic reactions, lung infection, heart attack, stroke, and death. All questions answered. )        Anesthesia Quick Evaluation

## 2023-12-09 NOTE — Interval H&P Note (Signed)
History and Physical Interval Note:  12/09/2023 8:53 AM  Emma Larsen  has presented today for surgery, with the diagnosis of LEFT TRIGGER THUMB.  The various methods of treatment have been discussed with the patient and family. After consideration of risks, benefits and other options for treatment, the patient has consented to  Procedure(s): RELEASE TRIGGER FINGER/A-1 PULLEY (Left) as a surgical intervention.  The patient's history has been reviewed, patient examined, no change in status, stable for surgery.  I have reviewed the patient's chart and labs.  Questions were answered to the patient's satisfaction.     Sheral Apley

## 2023-12-10 ENCOUNTER — Encounter (HOSPITAL_BASED_OUTPATIENT_CLINIC_OR_DEPARTMENT_OTHER): Payer: Self-pay | Admitting: Orthopedic Surgery

## 2023-12-11 ENCOUNTER — Other Ambulatory Visit: Payer: Self-pay

## 2023-12-11 NOTE — Progress Notes (Signed)
Specialty Pharmacy Refill Coordination Note  Emma Larsen is a 53 y.o. female contacted today regarding refills of specialty medication(s) Omalizumab [name])   Patient requested Courier to Provider Office   Delivery date: 12/23/23   Verified address: [facility] [id number] [place] [address]   Medication will be filled on 12/22/23.

## 2023-12-12 ENCOUNTER — Other Ambulatory Visit: Payer: Self-pay

## 2023-12-12 NOTE — Progress Notes (Signed)
Clinical Intervention Note  Clinical Intervention Notes: Patient reports starting Tramadol, Zofran, and Meloxicam post surgery. Per Up to Date, no DDI with Xolair. No further intervention required.   Clinical Intervention Outcomes: Prevention of an adverse drug event   Bobette Mo Specialty Pharmacist

## 2023-12-19 ENCOUNTER — Other Ambulatory Visit: Payer: Self-pay | Admitting: Allergy & Immunology

## 2023-12-19 DIAGNOSIS — M65312 Trigger thumb, left thumb: Secondary | ICD-10-CM | POA: Diagnosis not present

## 2023-12-22 ENCOUNTER — Other Ambulatory Visit: Payer: Self-pay

## 2023-12-30 ENCOUNTER — Ambulatory Visit: Payer: Commercial Managed Care - PPO
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
## 2024-01-02 ENCOUNTER — Ambulatory Visit: Payer: Commercial Managed Care - PPO | Attending: Orthopedic Surgery | Admitting: Occupational Therapy

## 2024-01-05 DIAGNOSIS — F4320 Adjustment disorder, unspecified: Secondary | ICD-10-CM | POA: Diagnosis not present

## 2024-01-06 ENCOUNTER — Ambulatory Visit (INDEPENDENT_AMBULATORY_CARE_PROVIDER_SITE_OTHER): Payer: Commercial Managed Care - PPO | Admitting: *Deleted

## 2024-01-06 DIAGNOSIS — L501 Idiopathic urticaria: Secondary | ICD-10-CM | POA: Diagnosis not present

## 2024-01-12 DIAGNOSIS — M65312 Trigger thumb, left thumb: Secondary | ICD-10-CM | POA: Diagnosis not present

## 2024-01-12 DIAGNOSIS — M6281 Muscle weakness (generalized): Secondary | ICD-10-CM | POA: Diagnosis not present

## 2024-01-12 DIAGNOSIS — M25642 Stiffness of left hand, not elsewhere classified: Secondary | ICD-10-CM | POA: Diagnosis not present

## 2024-01-15 ENCOUNTER — Other Ambulatory Visit (HOSPITAL_COMMUNITY): Payer: Self-pay

## 2024-01-20 ENCOUNTER — Other Ambulatory Visit: Payer: Self-pay

## 2024-01-20 NOTE — Progress Notes (Signed)
 Specialty Pharmacy Refill Coordination Note  Emma Larsen is a 53 y.o. female contacted today regarding refills of specialty medication(s) Omalizumab [name])   Patient requested Courier to Provider Office   Delivery date: 01/22/24   Verified address: [facility] [address] [place] [address]   Medication will be filled on 03.13.25.

## 2024-01-21 DIAGNOSIS — M25642 Stiffness of left hand, not elsewhere classified: Secondary | ICD-10-CM | POA: Diagnosis not present

## 2024-01-21 DIAGNOSIS — M65312 Trigger thumb, left thumb: Secondary | ICD-10-CM | POA: Diagnosis not present

## 2024-01-21 DIAGNOSIS — M6281 Muscle weakness (generalized): Secondary | ICD-10-CM | POA: Diagnosis not present

## 2024-01-27 ENCOUNTER — Ambulatory Visit: Payer: Medicaid Other | Admitting: Allergy & Immunology

## 2024-01-28 DIAGNOSIS — M6281 Muscle weakness (generalized): Secondary | ICD-10-CM | POA: Diagnosis not present

## 2024-01-28 DIAGNOSIS — M65312 Trigger thumb, left thumb: Secondary | ICD-10-CM | POA: Diagnosis not present

## 2024-01-28 DIAGNOSIS — M25642 Stiffness of left hand, not elsewhere classified: Secondary | ICD-10-CM | POA: Diagnosis not present

## 2024-02-02 DIAGNOSIS — M545 Low back pain, unspecified: Secondary | ICD-10-CM | POA: Diagnosis not present

## 2024-02-03 ENCOUNTER — Ambulatory Visit: Payer: Commercial Managed Care - PPO | Admitting: *Deleted

## 2024-02-03 DIAGNOSIS — L501 Idiopathic urticaria: Secondary | ICD-10-CM | POA: Diagnosis not present

## 2024-02-04 DIAGNOSIS — M25642 Stiffness of left hand, not elsewhere classified: Secondary | ICD-10-CM | POA: Diagnosis not present

## 2024-02-04 DIAGNOSIS — M6281 Muscle weakness (generalized): Secondary | ICD-10-CM | POA: Diagnosis not present

## 2024-02-04 DIAGNOSIS — M65312 Trigger thumb, left thumb: Secondary | ICD-10-CM | POA: Diagnosis not present

## 2024-02-04 NOTE — Progress Notes (Deleted)
   [address] [place] [address] Dept: [phone]  FOLLOW UP NOTE  Patient ID: Emma Larsen, female    DOB: 06-01-1971  Age: 53 y.o. MRN: [id number] Date of Office Visit: 02/05/2024  Assessment  Chief Complaint: No chief complaint on file.  HPI Emma Larsen is a 53 year old female who presents to the clinic for follow-up visit.  She was last seen in this clinic on 07/29/2023 by Dr. Dellis Anes for evaluation of shortness of breath, allergic rhinitis, and urticaria on Xolair.  Her last environmental allergy skin testing was on 02/19/2018 was positive to tree pollen, weed pollen, grass pollen, indoor mold, dust mite, cat, dog, and cockroach.  Discussed the use of AI scribe software for clinical note transcription with the patient, who gave verbal consent to proceed.  History of Present Illness          Results reviewed EXAM: CT MAXILLOFACIAL WITHOUT CONTRAST   TECHNIQUE: Multidetector CT images of the paranasal sinuses were obtained using the standard protocol without intravenous contrast.   COMPARISON:  None.   FINDINGS: Paranasal sinuses:   Frontal: Normally aerated. Patent frontal sinus drainage pathways.   Ethmoid: Normally aerated.   Maxillary: Normally aerated.   Sphenoid: Normally aerated. Patent sphenoethmoidal recesses.   Right ostiomeatal unit: Narrow middle meatus due to concha bullosa. Narrow infundibulum due to prominent ethmoid bulla.   Left ostiomeatal unit: Narrow middle meatus due to concha bullosa. Narrow infundibulum due to prominent ethmoid bulla.   Nasal passages: Patent. Congested appearance of nasal mucosa. Slight leftward septal deviation.   Anatomy: No pneumatization superior to anterior ethmoid notches. Sellar sphenoid pneumatization pattern. No dehiscence of carotid or optic canals. No onodi cell.   Other: No incidental finding on soft tissue windows.   IMPRESSION: 1.  No active sinusitis. 2. Congested appearance of nasal  mucosa. Right more than left concha bullosa narrowing the bilateral middle meatus.     Electronically Signed   By: Tiburcio Pea M.D.   On: 07/29/2021 10:18  Drug Allergies:  Allergies  Allergen Reactions   Sulfa Antibiotics Anaphylaxis    Physical Exam: LMP 11/12/2021 (Approximate) Comment: irregular   Physical Exam  Diagnostics:    Assessment and Plan: No diagnosis found.  No orders of the defined types were placed in this encounter.   There are no Patient Instructions on file for this visit.  No follow-ups on file.    Thank you for the opportunity to care for this patient.  Please do not hesitate to contact me with questions.  Thermon Leyland, FNP [facility]

## 2024-02-05 ENCOUNTER — Ambulatory Visit: Admitting: Family Medicine

## 2024-02-11 ENCOUNTER — Other Ambulatory Visit (HOSPITAL_COMMUNITY): Payer: Self-pay

## 2024-03-01 ENCOUNTER — Other Ambulatory Visit: Payer: Self-pay

## 2024-03-01 ENCOUNTER — Other Ambulatory Visit (HOSPITAL_COMMUNITY): Payer: Self-pay

## 2024-03-01 NOTE — Progress Notes (Signed)
 Specialty Pharmacy Refill Coordination Note  Emma Larsen is a 53 y.o. female contacted today regarding refills of specialty medication(s) Omalizumab  (XOLAIR )   Patient requested Courier to Provider Office   Delivery date: 03/02/24   Verified address: [facility] [address] [place] [address]   Medication will be filled on 03/02/24.

## 2024-03-02 ENCOUNTER — Ambulatory Visit

## 2024-03-02 ENCOUNTER — Other Ambulatory Visit (HOSPITAL_COMMUNITY): Payer: Self-pay

## 2024-03-02 ENCOUNTER — Other Ambulatory Visit: Payer: Self-pay

## 2024-03-02 DIAGNOSIS — L501 Idiopathic urticaria: Secondary | ICD-10-CM

## 2024-03-05 DIAGNOSIS — M25562 Pain in left knee: Secondary | ICD-10-CM | POA: Diagnosis not present

## 2024-03-05 DIAGNOSIS — M545 Low back pain, unspecified: Secondary | ICD-10-CM | POA: Diagnosis not present

## 2024-03-05 DIAGNOSIS — M65311 Trigger thumb, right thumb: Secondary | ICD-10-CM | POA: Diagnosis not present

## 2024-03-08 ENCOUNTER — Other Ambulatory Visit: Payer: Self-pay

## 2024-03-08 ENCOUNTER — Ambulatory Visit (INDEPENDENT_AMBULATORY_CARE_PROVIDER_SITE_OTHER): Admitting: Allergy & Immunology

## 2024-03-08 ENCOUNTER — Encounter: Payer: Self-pay | Admitting: Allergy & Immunology

## 2024-03-08 ENCOUNTER — Other Ambulatory Visit (HOSPITAL_COMMUNITY): Payer: Self-pay

## 2024-03-08 VITALS — BP 126/82 | HR 86 | Temp 98.1°F | Resp 18 | Ht 61.5 in | Wt 157.0 lb

## 2024-03-08 DIAGNOSIS — L501 Idiopathic urticaria: Secondary | ICD-10-CM | POA: Diagnosis not present

## 2024-03-08 DIAGNOSIS — J329 Chronic sinusitis, unspecified: Secondary | ICD-10-CM | POA: Diagnosis not present

## 2024-03-08 DIAGNOSIS — J302 Other seasonal allergic rhinitis: Secondary | ICD-10-CM

## 2024-03-08 DIAGNOSIS — J3089 Other allergic rhinitis: Secondary | ICD-10-CM

## 2024-03-08 MED ORDER — NURTEC 75 MG PO TBDP
75.0000 mg | ORAL_TABLET | Freq: Two times a day (BID) | ORAL | 2 refills | Status: DC | PRN
  Filled 2024-03-08 – 2024-03-09 (×2): qty 30, 10d supply, fill #0

## 2024-03-08 MED ORDER — CARBINOXAMINE MALEATE 4 MG PO TABS
4.0000 mg | ORAL_TABLET | Freq: Two times a day (BID) | ORAL | 1 refills | Status: DC
  Filled 2024-03-08: qty 180, 90d supply, fill #0

## 2024-03-08 MED ORDER — FAMOTIDINE 40 MG PO TABS
40.0000 mg | ORAL_TABLET | Freq: Two times a day (BID) | ORAL | 1 refills | Status: DC
  Filled 2024-03-08: qty 150, 75d supply, fill #0
  Filled 2024-06-30 – 2024-10-12 (×2): qty 150, 75d supply, fill #1

## 2024-03-08 NOTE — Progress Notes (Unsigned)
 FOLLOW UP  Date of Service/Encounter:  03/08/24   Assessment:   Seasonal and perennial allergic rhinitis (trees, weeds, grasses, indoor molds, dust mites, cat, dog and cockroach) - was briefly on allergen immunotherapy and interested in restarting, therefore recommended repeat testing   Adverse food reaction (seafood) - with negative skin testing to seafood and now tolerates   Shortness of breath - diagnosed as neurogenic cough (markedly improved with decrease in stress in her life)   Chronic urticaria - well controlled with Xolair  monthly  Plan/Recommendations:   1. Seasonal and perennial allergic rhinitis - Continue with carbinoxamine  4mg  twice daily.  - Start Ryaltris two sprays per nostril twice daily (sample provided).  - Let's plan to retest your allergies and consider allergy shots.   2. Chronic autoimmune urticaria  - Continue with Xolair  monthly as you are doing.  - These are under good control.   3. GERD - Continue with famotidine  40mg  twice daily.  - Stop the omeprazole  and pantoprazole .  - Definitely see GI again for an endoscopy.   4. Return in about 4 weeks (around 04/05/2024) for ALLERGY TESTING. You can have the follow up appointment with Dr. Idolina Maker or a Nurse Practicioner (our Nurse Practitioners are excellent and always have Physician oversight!).   Subjective:   Emma Larsen is a 53 y.o. female presenting today for follow up of  Chief Complaint  Patient presents with   Urticaria    No issues    Allergic Rhinitis     Started saline rinses - used it 2nd full week of use. Thick flem- some color last week not clear. Itchy ears     Emma Larsen has a history of the following: Patient Active Problem List   Diagnosis Date Noted   Trigger finger of left thumb 12/09/2023   Impacted cerumen of both ears 04/30/2021   Tendonitis of wrist, right 03/07/2021   Constipation 12/02/2019   Allergy to environmental factors 12/02/2019   Ear itching  11/09/2019   Globus sensation 11/09/2019   Schatzki's ring 10/19/2019   Chronic cough 09/08/2019   Dysphonia 09/08/2019   Laryngospasm 09/08/2019   Sinusitis, acute 07/08/2018   Simple chronic bronchitis (HCC) 06/03/2018   Seasonal and perennial allergic rhinitis 02/19/2018   SOB (shortness of breath) 02/16/2018   Adverse food reaction 02/16/2018   Chronic rhinitis 02/16/2018   Gastroesophageal reflux disease 02/16/2018    History obtained from: chart review and patient.  Discussed the use of AI scribe software for clinical note transcription with the patient and/or guardian, who gave verbal consent to proceed.  Emma Larsen is a 53 y.o. female presenting for a follow up visit.  She was last seen in September 2024.  At that time, we started her on Ryaltris 2 sprays per nostril twice daily.  We also sent her to see Dr. Westley Hammers at [facility].  Did talk about doing allergy shots for long-term control.  For her autoimmune urticaria, we continue with Xolair  monthly.  For her GERD, she we discontinued omeprazole  and famotidine  and started Protonix  instead.  We recommended that she see gastroenterology again for an endoscopy.  Since the last visit, she has done well.   Allergic Rhinitis Symptom History: She experiences sinus pressure and tenderness, particularly around her face, and tingling in her ears. A persistent cough worsens with stress or excitement, and her teeth hurt when she feels something is coming on. She uses a Navage machine to help clear mucus, which has been beneficial.  She  remains on carbinoxamine  4 mg twice a day. She also uses Lumify and Pataday  eye drops, though she is unsure if Pataday  was ever prescribed. She has been on allergy shots in the past and is currently receiving Xolair  shots once a month. She has a history of allergies and was on allergy shots back in 2019. She has not had an allergy test since 2019.  Skin Symptom History: Hives are under good control with Xolair   monthly.  GERD Symptom History: She is currently on several medications including pantoprazole  and omeprazole .  She works from home, which was initially supposed to be a hybrid position but is now fully remote. She leaves the house to go shopping for social interaction and is applying for leadership positions to advance her career.   Otherwise, there have been no changes to her past medical history, surgical history, family history, or social history.    Review of systems otherwise negative other than that mentioned in the HPI.    Objective:   Blood pressure 126/82, pulse 86, temperature 98.1 F (36.7 C), temperature source Temporal, resp. rate 18, height 5' 1.5" (1.562 m), weight 157 lb (71.2 kg), last menstrual period 11/12/2021, SpO2 97%. Body mass index is 29.18 kg/m.    Physical Exam Vitals reviewed.  Constitutional:      Appearance: She is well-developed.     Comments: Talkative. Seems much happier.   HENT:     Head: Normocephalic and atraumatic.     Right Ear: Tympanic membrane, ear canal and external ear normal.     Left Ear: Tympanic membrane, ear canal and external ear normal.     Nose: Mucosal edema and rhinorrhea present. No nasal deformity or septal deviation.     Right Turbinates: Enlarged, swollen and pale.     Left Turbinates: Enlarged, swollen and pale.     Right Sinus: No maxillary sinus tenderness or frontal sinus tenderness.     Left Sinus: No maxillary sinus tenderness or frontal sinus tenderness.     Mouth/Throat:     Mouth: Mucous membranes are not pale and not dry.     Pharynx: Uvula midline.     Comments: Cobblestoning in the posterior oropharynx. Eyes:     General: Lids are normal. Allergic shiner present.        Right eye: No discharge.        Left eye: No discharge.     Conjunctiva/sclera: Conjunctivae normal.     Right eye: Right conjunctiva is not injected. No chemosis.    Left eye: Left conjunctiva is not injected. No chemosis.    Pupils:  Pupils are equal, round, and reactive to light.  Cardiovascular:     Rate and Rhythm: Normal rate and regular rhythm.     Heart sounds: Normal heart sounds.  Pulmonary:     Effort: Pulmonary effort is normal. No tachypnea, accessory muscle usage or respiratory distress.     Breath sounds: Normal breath sounds. No wheezing, rhonchi or rales.     Comments: Moving air well in all lung fields. No increased work of breathing noted.  Chest:     Chest wall: No tenderness.  Lymphadenopathy:     Cervical: No cervical adenopathy.  Skin:    General: Skin is warm.     Capillary Refill: Capillary refill takes less than 2 seconds.     Coloration: Skin is not pale.     Findings: No abrasion, erythema, petechiae or rash. Rash is not papular, urticarial or vesicular.  Comments: No urticaria noted today.   Neurological:     Mental Status: She is alert.  Psychiatric:        Behavior: Behavior is cooperative.      Diagnostic studies: none      Drexel Gentles, MD  [facility] 

## 2024-03-08 NOTE — Patient Instructions (Addendum)
 1. Seasonal and perennial allergic rhinitis - Continue with carbinoxamine  4mg  twice daily.  - Start Ryaltris two sprays per nostril twice daily (sample provided).  - Let's plan to retest your allergies and consider allergy shots.   2. Chronic autoimmune urticaria  - Continue with Xolair  monthly as you are doing.  - These are under good control.   3. GERD - Continue with famotidine  40mg  twice daily.  - Stop the omeprazole  and pantoprazole .  - Definitely see GI again for an endoscopy.   4. Return in about 4 weeks (around 04/05/2024) for ALLERGY TESTING. You can have the follow up appointment with Dr. Idolina Maker or a Nurse Practicioner (our Nurse Practitioners are excellent and always have Physician oversight!).    Please inform us  of any Emergency Department visits, hospitalizations, or changes in symptoms. Call us  before going to the ED for breathing or allergy symptoms since we might be able to fit you in for a sick visit. Feel free to contact us  anytime with any questions, problems, or concerns.  It was a pleasure to see you again today!  Websites that have reliable patient information: 1. American Academy of Asthma, Allergy, and Immunology: www.aaaai.org 2. Food Allergy Research and Education (FARE): foodallergy.org 3. Mothers of Asthmatics: http://www.asthmacommunitynetwork.org 4. American College of Allergy, Asthma, and Immunology: www.acaai.org      "Like" us  on Facebook and Instagram for our latest updates!      A healthy democracy works best when [employer] participate! Make sure you are registered to vote! If you have moved or changed any of your contact information, you will need to get this updated before voting! Scan the QR codes below to learn more!

## 2024-03-09 ENCOUNTER — Encounter: Payer: Self-pay | Admitting: Allergy & Immunology

## 2024-03-09 ENCOUNTER — Other Ambulatory Visit: Payer: Self-pay

## 2024-03-09 ENCOUNTER — Other Ambulatory Visit (HOSPITAL_COMMUNITY): Payer: Self-pay

## 2024-03-09 ENCOUNTER — Telehealth: Payer: Self-pay | Admitting: Allergy & Immunology

## 2024-03-09 MED ORDER — NURTEC 75 MG PO TBDP: ORAL_TABLET | ORAL | 2 refills | Status: AC

## 2024-03-09 NOTE — Telephone Encounter (Signed)
 Patient's pharmacy called stating they need clarification on the directions for Nurtec. The pharmacy states a patient usually takes Nurtec once a day or every other day.

## 2024-03-09 NOTE — Telephone Encounter (Signed)
 Once every other day is fine as needed.

## 2024-03-10 ENCOUNTER — Other Ambulatory Visit (HOSPITAL_COMMUNITY): Payer: Self-pay

## 2024-03-10 DIAGNOSIS — M65312 Trigger thumb, left thumb: Secondary | ICD-10-CM | POA: Diagnosis not present

## 2024-03-10 DIAGNOSIS — M25642 Stiffness of left hand, not elsewhere classified: Secondary | ICD-10-CM | POA: Diagnosis not present

## 2024-03-10 DIAGNOSIS — M6281 Muscle weakness (generalized): Secondary | ICD-10-CM | POA: Diagnosis not present

## 2024-03-11 ENCOUNTER — Other Ambulatory Visit (HOSPITAL_BASED_OUTPATIENT_CLINIC_OR_DEPARTMENT_OTHER): Payer: Self-pay

## 2024-03-11 ENCOUNTER — Other Ambulatory Visit: Payer: Self-pay

## 2024-03-11 ENCOUNTER — Other Ambulatory Visit (HOSPITAL_COMMUNITY): Payer: Self-pay

## 2024-03-11 ENCOUNTER — Telehealth: Payer: Self-pay

## 2024-03-11 MED ORDER — NURTEC 75 MG PO TBDP
75.0000 mg | ORAL_TABLET | ORAL | 2 refills | Status: AC | PRN
  Filled 2024-03-11: qty 30, 60d supply, fill #0
  Filled 2024-06-30: qty 15, 30d supply, fill #0

## 2024-03-11 MED ORDER — NURTEC 75 MG PO TBDP
75.0000 mg | ORAL_TABLET | Freq: Every day | ORAL | 0 refills | Status: AC | PRN
  Filled 2024-10-12 – 2024-12-03 (×2): qty 8, 30d supply, fill #0

## 2024-03-11 NOTE — Telephone Encounter (Signed)
*  Asthma/Allergy  Pharmacy Patient Advocate Encounter   Received notification from CoverMyMeds that prior authorization for Nurtec 75MG  dispersible tablets  is required/requested.   Insurance verification completed.   The patient is insured through [facility] .   Per test claim: PA required; PA submitted to above mentioned insurance via CoverMyMeds Key/confirmation #/EOC [id number] Status is pending

## 2024-03-15 NOTE — Telephone Encounter (Signed)
 The request has been approved. The authorization is effective from 03/13/2024 to 09/09/2024, as long as the member is enrolled in their current health plan. The request was approved as submitted. This request has been approved for up to 18 tablets per 30 days. A written notification letter will follow with additional details.

## 2024-03-16 ENCOUNTER — Other Ambulatory Visit (HOSPITAL_COMMUNITY): Payer: Self-pay

## 2024-03-16 MED ORDER — METHYLPREDNISOLONE 4 MG PO TBPK
ORAL_TABLET | ORAL | 0 refills | Status: AC
Start: 2024-03-16 — End: ?
  Filled 2024-03-16: qty 21, 6d supply, fill #0

## 2024-03-18 ENCOUNTER — Other Ambulatory Visit: Payer: Self-pay

## 2024-03-19 ENCOUNTER — Other Ambulatory Visit: Payer: Self-pay

## 2024-03-19 ENCOUNTER — Other Ambulatory Visit: Payer: Self-pay | Admitting: Internal Medicine

## 2024-03-19 NOTE — Progress Notes (Signed)
 Specialty Pharmacy Refill Coordination Note  Emma Larsen is a 53 y.o. female contacted today regarding refills of specialty medication(s) Omalizumab  (XOLAIR )   Patient requested (Patient-Rptd) Delivery   Delivery date: 03/24/24   Verified address: (Patient-Rptd) [facility]   Medication will be filled on 03/23/24, pending refill approval.     Medication appears to have been discontinued in error at last appointment.

## 2024-03-22 ENCOUNTER — Other Ambulatory Visit: Payer: Self-pay | Admitting: Pharmacist

## 2024-03-22 ENCOUNTER — Other Ambulatory Visit: Payer: Self-pay

## 2024-03-22 ENCOUNTER — Other Ambulatory Visit (HOSPITAL_COMMUNITY): Payer: Self-pay

## 2024-03-22 ENCOUNTER — Other Ambulatory Visit: Payer: Self-pay | Admitting: Allergy & Immunology

## 2024-03-22 MED ORDER — OMALIZUMAB 150 MG/ML ~~LOC~~ SOSY
300.0000 mg | PREFILLED_SYRINGE | SUBCUTANEOUS | 11 refills | Status: AC
  Filled 2024-03-22: qty 2, 28d supply, fill #0
  Filled 2024-07-13: qty 2, 28d supply, fill #1
  Filled 2024-08-06: qty 2, 28d supply, fill #2
  Filled 2024-09-07: qty 2, 28d supply, fill #3
  Filled 2024-10-04: qty 2, 28d supply, fill #4
  Filled 2024-10-26: qty 2, 28d supply, fill #5
  Filled 2024-11-29: qty 2, 28d supply, fill #6

## 2024-03-22 MED ORDER — OMALIZUMAB 150 MG/ML ~~LOC~~ SOSY
300.0000 mg | PREFILLED_SYRINGE | SUBCUTANEOUS | 11 refills | Status: DC
  Filled 2024-03-22: qty 2, 28d supply, fill #0

## 2024-03-23 ENCOUNTER — Other Ambulatory Visit (HOSPITAL_COMMUNITY): Payer: Self-pay

## 2024-03-23 ENCOUNTER — Other Ambulatory Visit: Payer: Self-pay

## 2024-03-30 ENCOUNTER — Ambulatory Visit (INDEPENDENT_AMBULATORY_CARE_PROVIDER_SITE_OTHER)

## 2024-03-30 DIAGNOSIS — L501 Idiopathic urticaria: Secondary | ICD-10-CM | POA: Diagnosis not present

## 2024-04-06 ENCOUNTER — Ambulatory Visit: Admitting: Allergy & Immunology

## 2024-04-07 DIAGNOSIS — M545 Low back pain, unspecified: Secondary | ICD-10-CM | POA: Diagnosis not present

## 2024-04-07 DIAGNOSIS — M532X6 Spinal instabilities, lumbar region: Secondary | ICD-10-CM | POA: Diagnosis not present

## 2024-04-16 ENCOUNTER — Other Ambulatory Visit: Payer: Self-pay

## 2024-04-20 ENCOUNTER — Other Ambulatory Visit: Payer: Self-pay

## 2024-04-26 ENCOUNTER — Other Ambulatory Visit: Payer: Self-pay

## 2024-04-26 ENCOUNTER — Encounter (HOSPITAL_BASED_OUTPATIENT_CLINIC_OR_DEPARTMENT_OTHER): Payer: Self-pay | Admitting: Orthopedic Surgery

## 2024-04-27 ENCOUNTER — Ambulatory Visit
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
## 2024-05-04 ENCOUNTER — Encounter (HOSPITAL_BASED_OUTPATIENT_CLINIC_OR_DEPARTMENT_OTHER): Admission: RE | Payer: Self-pay | Source: Home / Self Care

## 2024-05-04 ENCOUNTER — Ambulatory Visit (HOSPITAL_BASED_OUTPATIENT_CLINIC_OR_DEPARTMENT_OTHER): Admission: RE | Admit: 2024-05-04 | Source: Home / Self Care | Admitting: Orthopedic Surgery

## 2024-05-04 DIAGNOSIS — Z01818 Encounter for other preprocedural examination: Secondary | ICD-10-CM

## 2024-05-04 HISTORY — DX: Urticaria, unspecified: L50.9

## 2024-05-04 HISTORY — DX: Allergy, unspecified, initial encounter: T78.40XA

## 2024-05-04 HISTORY — DX: Gastro-esophageal reflux disease without esophagitis: K21.9

## 2024-05-04 SURGERY — RELEASE, A1 PULLEY, FOR TRIGGER FINGER
Anesthesia: Choice | Laterality: Right

## 2024-05-10 DIAGNOSIS — N760 Acute vaginitis: Secondary | ICD-10-CM | POA: Diagnosis not present

## 2024-05-10 DIAGNOSIS — N898 Other specified noninflammatory disorders of vagina: Secondary | ICD-10-CM | POA: Diagnosis not present

## 2024-05-10 DIAGNOSIS — Z1331 Encounter for screening for depression: Secondary | ICD-10-CM | POA: Diagnosis not present

## 2024-05-10 DIAGNOSIS — Z1231 Encounter for screening mammogram for malignant neoplasm of breast: Secondary | ICD-10-CM | POA: Diagnosis not present

## 2024-05-10 DIAGNOSIS — Z01419 Encounter for gynecological examination (general) (routine) without abnormal findings: Secondary | ICD-10-CM | POA: Diagnosis not present

## 2024-06-02 DIAGNOSIS — F4320 Adjustment disorder, unspecified: Secondary | ICD-10-CM | POA: Diagnosis not present

## 2024-06-24 ENCOUNTER — Ambulatory Visit (INDEPENDENT_AMBULATORY_CARE_PROVIDER_SITE_OTHER): Admitting: Family Medicine

## 2024-06-24 ENCOUNTER — Encounter: Payer: Self-pay | Admitting: Family Medicine

## 2024-06-24 ENCOUNTER — Other Ambulatory Visit: Payer: Self-pay

## 2024-06-24 VITALS — BP 120/84 | HR 96 | Temp 98.1°F

## 2024-06-24 DIAGNOSIS — T7800XD Anaphylactic reaction due to unspecified food, subsequent encounter: Secondary | ICD-10-CM

## 2024-06-24 DIAGNOSIS — T7800XA Anaphylactic reaction due to unspecified food, initial encounter: Secondary | ICD-10-CM

## 2024-06-24 DIAGNOSIS — L501 Idiopathic urticaria: Secondary | ICD-10-CM | POA: Diagnosis not present

## 2024-06-24 DIAGNOSIS — R0602 Shortness of breath: Secondary | ICD-10-CM | POA: Diagnosis not present

## 2024-06-24 DIAGNOSIS — J3089 Other allergic rhinitis: Secondary | ICD-10-CM | POA: Diagnosis not present

## 2024-06-24 DIAGNOSIS — J302 Other seasonal allergic rhinitis: Secondary | ICD-10-CM

## 2024-06-24 MED ORDER — PREDNISONE 10 MG PO TABS
ORAL_TABLET | ORAL | 0 refills | Status: DC
Start: 2024-06-24 — End: 2024-07-20

## 2024-06-24 NOTE — Patient Instructions (Addendum)
 Shortness of breath Continue albuterol  2 puffs once every 4 hours as needed for cough or wheeze You may use albuterol  2 puffs 5 to 15 minutes before activity to decrease cough or wheeze  Allergic rhinitis Begin Flonase  2 sprays in each nostril once a day for nasal congestion Begin azelastine  2 sprays in each nostril twice a day for runny nose Begin Mucinex  600 to 1200 mg twice a day and increase fluid intake as tolerated to thin mucus Begin saline nasal rinses as needed for nasal symptoms. Use this before any medicated nasal sprays for best result If no improvement, begin prednisone  10 mg twice a day for 4 days and once a day on the fifth day Continue allergen avoidance measures directed toward grass pollen, weed pollen, tree pollen, indoor mold, dust mite, cat, dog and cockroach as listed below Continue carbinoxamine  4 mg twice a day if needed for runny nose or itch Consider allergen immunotherapy if your symptoms not well-controlled with the treatment plan as listed above Return to the clinic for your allergy skin testing appointment on 07/20/2024  Chronic urticaria Continue carbinoxamine  4 mg twice a day if needed for itch.  May add famotidine  20 mg once or twice a day if needed for itch Restart Xolair  injections once every 28 days and have access to an epinephrine  autoinjector sent per protocol after your allergy testing appointment  Food allergy Continue to avoid shellfish, especially shrimp.  In case of an allergic reaction, take cetirizine  10 mg every 12-24 hours, and if life-threatening symptoms occur, inject with AuviQ 0.3 mg.  Call the clinic if this treatment plan is not working well for you.    Follow up on 07/20/2024 or sooner if needed.  Reducing Pollen Exposure The American Academy of Allergy, Asthma and Immunology suggests the following steps to reduce your exposure to pollen during allergy seasons. Do not hang sheets or clothing out to dry; pollen may collect on these  items. Do not mow lawns or spend time around freshly cut grass; mowing stirs up pollen. Keep windows closed at night.  Keep car windows closed while driving. Minimize morning activities outdoors, a time when pollen counts are usually at their highest. Stay indoors as much as possible when pollen counts or humidity is high and on windy days when pollen tends to remain in the air longer. Use air conditioning when possible.  Many air conditioners have filters that trap the pollen spores. Use a HEPA room air filter to remove pollen form the indoor air you breathe.  Control of Mold Allergen Mold and fungi can grow on a variety of surfaces provided certain temperature and moisture conditions exist.  Outdoor molds grow on plants, decaying vegetation and soil.  The major outdoor mold, Alternaria and Cladosporium, are found in very high numbers during hot and dry conditions.  Generally, a late Summer - Fall peak is seen for common outdoor fungal spores.  Rain will temporarily lower outdoor mold spore count, but counts rise rapidly when the rainy period ends.  The most important indoor molds are Aspergillus and Penicillium.  Dark, humid and poorly ventilated basements are ideal sites for mold growth.  The next most common sites of mold growth are the bathroom and the kitchen.  Outdoor [employer] Use air conditioning and keep windows closed Avoid exposure to decaying vegetation. Avoid leaf raking. Avoid grain handling. Consider wearing a face mask if working in moldy areas.  Indoor Mold Control Maintain humidity below 50%. Clean washable surfaces with 5%  bleach solution. Remove sources e.g. Contaminated carpets.  Control of Dog or Cat Allergen Avoidance is the best way to manage a dog or cat allergy. If you have a dog or cat and are allergic to dog or cats, consider removing the dog or cat from the home. If you have a dog or cat but don't want to find it a new home, or if your family wants a pet even  though someone in the household is allergic, here are some strategies that may help keep symptoms at bay:  Keep the pet out of your bedroom and restrict it to only a few rooms. Be advised that keeping the dog or cat in only one room will not limit the allergens to that room. Don't pet, hug or kiss the dog or cat; if you do, wash your hands with soap and water. High-efficiency particulate air (HEPA) cleaners run continuously in a bedroom or living room can reduce allergen levels over time. Regular use of a high-efficiency vacuum cleaner or a central vacuum can reduce allergen levels. Giving your dog or cat a bath at least once a week can reduce airborne allergen.   Control of Dust Mite Allergen Dust mites play a major role in allergic asthma and rhinitis. They occur in environments with high humidity wherever human skin is found. Dust mites absorb humidity from the atmosphere (ie, they do not drink) and feed on organic matter (including shed human and animal skin). Dust mites are a microscopic type of insect that you cannot see with the naked eye. High levels of dust mites have been detected from mattresses, pillows, carpets, upholstered furniture, bed covers, clothes, soft toys and any woven material. The principal allergen of the dust mite is found in its feces. A gram of dust may contain 1,000 mites and 250,000 fecal particles. Mite antigen is easily measured in the air during house cleaning activities. Dust mites do not bite and do not cause harm to humans, other than by triggering allergies/asthma.  Ways to decrease your exposure to dust mites in your home:  1. Encase mattresses, box springs and pillows with a mite-impermeable barrier or cover  2. Wash sheets, blankets and drapes weekly in hot water (130 F) with detergent and dry them in a dryer on the hot setting.  3. Have the room cleaned frequently with a vacuum cleaner and a damp dust-mop. For carpeting or rugs, vacuuming with a vacuum  cleaner equipped with a high-efficiency particulate air (HEPA) filter. The dust mite allergic individual should not be in a room which is being cleaned and should wait 1 hour after cleaning before going into the room.  4. Do not sleep on upholstered furniture (eg, couches).  5. If possible removing carpeting, upholstered furniture and drapery from the home is ideal. Horizontal blinds should be eliminated in the rooms where the person spends the most time (bedroom, study, television room). Washable vinyl, roller-type shades are optimal.  6. Remove all non-washable stuffed toys from the bedroom. Wash stuffed toys weekly like sheets and blankets above.  7. Reduce indoor humidity to less than 50%. Inexpensive humidity monitors can be purchased at most hardware stores. Do not use a humidifier as can make the problem worse and are not recommended.  Control of Cockroach Allergen Cockroach allergen has been identified as an important cause of acute attacks of asthma, especially in urban settings.  There are fifty-five species of cockroach that exist in the [place] , however only three, the [place], [place] and [place]  species produce allergen that can affect patients with Asthma.  Allergens can be obtained from fecal particles, egg casings and secretions from cockroaches.    Remove food sources. Reduce access to water. Seal access and entry points. Spray runways with 0.5-1% Diazinon or Chlorpyrifos Blow boric acid power under stoves and refrigerator. Place bait stations (hydramethylnon) at feeding sites.

## 2024-06-24 NOTE — Progress Notes (Signed)
 [address] [place] [address] Dept: [phone]  FOLLOW UP NOTE  Patient ID: Emma Larsen, female    DOB: 1971-04-11  Age: 53 y.o. MRN: [id number] Date of Office Visit: 06/24/2024  Assessment  Chief Complaint: Sinus Problem, Sinusitis, Cough, Nasal Congestion, and Headache  HPI Emma Larsen is a 53 year old female who presents to the clinic for follow-up visit.  She was last seen in this clinic on 03/08/2024 by Dr. 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  At today's visit, she reports that allergic rhinitis has been poorly controlled over the last several days with symptoms including cough, nasal congestion, postnasal drainage, throat clearing, itching in her throat, sore throat, and ear congestion.  She reports headache under both eyes over the last day.  She denies recent illness, fever, sweats, chills, or sick contacts.  She reports that she has recently had a negative home COVID test.  She continues carbinoxamine  and Ryaltris daily.  She is not currently using a nasal saline rinse.  We did review nasal spray application technique. Her last environmental allergy skin testing was on 02/19/2018 was positive to tree pollen, weed pollen, grass pollen, indoor mold, dust mite, cat, dog, and cockroach.  She reports her breathing has remained relatively well-controlled during this time with no shortness of breath, cough, or wheeze with activity or rest.  She reports that she rarely needs to use albuterol  and has not used albuterol  over the last several months.  She reports that she occasionally eats shrimp without adverse reaction.  She continues to eat fish without adverse reaction.  Skin prick testing from 02/19/2018 indicated positive shrimp allergy.  Of note, she has an upcoming appointment for skin prick testing.  Chronic urticaria is reported as moderately well-controlled with some  breakthrough hives occurring.  She has remained off of Xolair  injections in an effort update environmental and food allergy skin testing.  Once testing is complete, she intends to restart Xolair  injections.  EpiPen  sent is up-to-date.  Her current medications are listed in the chart.  Drug Allergies:  Allergies  Allergen Reactions   Sulfa Antibiotics Anaphylaxis    Physical Exam: BP 120/84   Pulse 96   Temp 98.1 F (36.7 C)   LMP 11/12/2021 (Approximate) Comment: irregular  SpO2 98%    Physical Exam Vitals reviewed.  Constitutional:      Appearance: Normal appearance. She is well-developed.  HENT:     Head: Normocephalic and atraumatic.     Right Ear: Tympanic membrane normal.     Left Ear: Tympanic membrane normal.     Nose:     Comments: Bilateral nares edematous and pale with thin clear nasal drainage noted.  Pharynx normal.  Ears normal.  Eyes normal.    Mouth/Throat:     Pharynx: Oropharynx is clear.  Eyes:     Conjunctiva/sclera: Conjunctivae normal.  Cardiovascular:     Rate and Rhythm: Normal rate and regular rhythm.     Heart sounds: Normal heart sounds. No murmur heard. Pulmonary:     Effort: Pulmonary effort is normal.     Breath sounds: Normal breath sounds.     Comments: Lungs clear to auscultation Musculoskeletal:        General: Normal range of motion.     Cervical back: Normal range of motion and neck supple.  Skin:    General: Skin is warm.  Neurological:  Mental Status: She is alert and oriented to person, place, and time.  Psychiatric:        Mood and Affect: Mood normal.        Behavior: Behavior normal.        Thought Content: Thought content normal.        Judgment: Judgment normal.    Assessment and Plan: 1. Idiopathic urticaria   2. Seasonal and perennial allergic rhinitis   3. Shortness of breath   4. Allergy with anaphylaxis due to food     Meds ordered this encounter  Medications   predniSONE  (DELTASONE ) 10 MG tablet    Sig:  Prednisone  10 mg tablets. Take 2 tablets once a day for 4 days, then take 1 tablet on the 5th day, then stop.    Dispense:  9 tablet    Refill:  0    Patient Instructions  Shortness of breath Continue albuterol  2 puffs once every 4 hours as needed for cough or wheeze You may use albuterol  2 puffs 5 to 15 minutes before activity to decrease cough or wheeze  Allergic rhinitis Begin Flonase  2 sprays in each nostril once a day for nasal congestion Begin azelastine  2 sprays in each nostril twice a day for runny nose Begin Mucinex  600 to 1200 mg twice a day and increase fluid intake as tolerated to thin mucus Begin saline nasal rinses as needed for nasal symptoms. Use this before any medicated nasal sprays for best result If no improvement, begin prednisone  10 mg twice a day for 4 days and once a day on the fifth day Continue allergen avoidance measures directed toward grass pollen, weed pollen, tree pollen, indoor mold, dust mite, cat, dog and cockroach as listed below Continue carbinoxamine  4 mg twice a day if needed for runny nose or itch Consider allergen immunotherapy if your symptoms not well-controlled with the treatment plan as listed above Return to the clinic for your allergy skin testing appointment on 07/20/2024  Chronic urticaria Continue carbinoxamine  4 mg twice a day if needed for itch.  May add famotidine  20 mg once or twice a day if needed for itch Restart Xolair  injections once every 28 days and have access to an epinephrine  autoinjector sent per protocol after your allergy testing appointment  Food allergy Continue to avoid shellfish, especially shrimp.  In case of an allergic reaction, take cetirizine  10 mg every 12-24 hours, and if life-threatening symptoms occur, inject with AuviQ 0.3 mg.  Call the clinic if this treatment plan is not working well for you.    Follow up on 07/20/2024 or sooner if needed.  Return in about 26 days (around 07/20/2024), or if symptoms worsen or  fail to improve.    Thank you for the opportunity to care for this patient.  Please do not hesitate to contact me with questions.  Arlean Mutter, FNP [facility] 

## 2024-06-30 ENCOUNTER — Other Ambulatory Visit (HOSPITAL_COMMUNITY): Payer: Self-pay

## 2024-06-30 MED ORDER — OLOPATADINE HCL 0.2 % OP SOLN: 1.0000 [drp] | OPHTHALMIC | 5 refills | Status: DC

## 2024-06-30 MED ORDER — AMOXICILLIN-POT CLAVULANATE 875-125 MG PO TABS: 1.0000 | ORAL_TABLET | Freq: Two times a day (BID) | ORAL | 0 refills | Status: AC

## 2024-06-30 NOTE — Addendum Note (Signed)
 Addended by: IVA MARTY SALTNESS on: 06/30/2024 10:10 AM   Modules accepted: Orders

## 2024-06-30 NOTE — Progress Notes (Signed)
 Patient called reporting continued sinus pain and pressure. Therefore I sent in Augmentin  for her. Patient aware.   Marty Shaggy, MD [facility] 

## 2024-06-30 NOTE — Addendum Note (Signed)
 Addended by: IVA MARTY SALTNESS on: 06/30/2024 10:05 AM   Modules accepted: Orders

## 2024-07-01 ENCOUNTER — Other Ambulatory Visit (HOSPITAL_COMMUNITY): Payer: Self-pay

## 2024-07-02 ENCOUNTER — Other Ambulatory Visit (HOSPITAL_COMMUNITY): Payer: Self-pay

## 2024-07-02 MED ORDER — IPRATROPIUM BROMIDE 0.06 % NA SOLN
2.0000 | Freq: Three times a day (TID) | NASAL | 5 refills | Status: AC
  Filled 2024-07-02: qty 15, 25d supply, fill #0
  Filled 2024-10-12: qty 15, 25d supply, fill #1

## 2024-07-02 MED ORDER — CARBINOXAMINE MALEATE 4 MG PO TABS
8.0000 mg | ORAL_TABLET | Freq: Two times a day (BID) | ORAL | 1 refills | Status: DC
  Filled 2024-07-02: qty 100, 25d supply, fill #0
  Filled 2024-07-02: qty 20, 5d supply, fill #0
  Filled 2024-07-13: qty 120, 30d supply, fill #1
  Filled 2024-10-12: qty 120, 30d supply, fill #2

## 2024-07-02 NOTE — Addendum Note (Signed)
 Addended by: IVA MARTY SALTNESS on: 07/02/2024 03:36 PM   Modules accepted: Orders

## 2024-07-02 NOTE — Progress Notes (Signed)
 Patient called.  She is continuing to have a lot of postnasal drip and coughing since stopping her Xolair .  We are doing this in anticipation of her allergy testing coming up next month.  We are going to try to control her postnasal drip with carbinoxamine  and Atrovent  nasal spray.  Marty Shaggy, MD [facility] 

## 2024-07-05 ENCOUNTER — Other Ambulatory Visit: Payer: Self-pay

## 2024-07-06 DIAGNOSIS — F4320 Adjustment disorder, unspecified: Secondary | ICD-10-CM | POA: Diagnosis not present

## 2024-07-06 MED ORDER — FLUCONAZOLE 150 MG PO TABS: 150.0000 mg | ORAL_TABLET | Freq: Once | ORAL | 1 refills | Status: AC

## 2024-07-06 NOTE — Addendum Note (Signed)
 Addended by: IVA MARTY SALTNESS on: 07/06/2024 02:51 PM   Modules accepted: Orders

## 2024-07-06 NOTE — Progress Notes (Signed)
 Patient called reporting a yeast infection. I am sending in fluconazole .

## 2024-07-13 ENCOUNTER — Other Ambulatory Visit: Payer: Self-pay | Admitting: Pharmacy Technician

## 2024-07-13 ENCOUNTER — Other Ambulatory Visit (HOSPITAL_COMMUNITY): Payer: Self-pay

## 2024-07-13 ENCOUNTER — Other Ambulatory Visit: Payer: Self-pay

## 2024-07-13 NOTE — Progress Notes (Signed)
 Specialty Pharmacy Refill Coordination Note  Emma Larsen is a 53 y.o. female contacted today regarding refills of specialty medication(s) Omalizumab  (XOLAIR )   Patient requested Courier to Provider Office   Delivery date: 07/15/24   Verified address: [facility] [address] [place], [place] [address]   Medication will be filled on 9/3/025.  Patient reported starting injection again. Injection due on 9/9. Clean claim $0.00

## 2024-07-13 NOTE — Progress Notes (Signed)
 Specialty Pharmacy Ongoing Clinical Assessment Note  Emma Larsen is a 53 y.o. female who is being followed by the specialty pharmacy service for RxSp Allergy   Patient's specialty medication(s) reviewed today: Omalizumab  (XOLAIR )   Missed doses in the last 4 weeks: 3 (held by MD for allergy testing)   Patient/Caregiver did not have any additional questions or concerns.   Therapeutic benefit summary: Patient is achieving benefit   Adverse events/side effects summary: No adverse events/side effects   Patient's therapy is appropriate to: Other    Goals Addressed             This Visit's Progress    Maintain optimal adherence to therapy   On track    Patient is on track. Patient will maintain adherence         Follow up: 12 months  Silvano LOISE Dolly Specialty Pharmacist

## 2024-07-20 ENCOUNTER — Other Ambulatory Visit (HOSPITAL_COMMUNITY): Payer: Self-pay

## 2024-07-20 ENCOUNTER — Ambulatory Visit: Admitting: Allergy & Immunology

## 2024-07-20 DIAGNOSIS — J3089 Other allergic rhinitis: Secondary | ICD-10-CM

## 2024-07-20 DIAGNOSIS — L501 Idiopathic urticaria: Secondary | ICD-10-CM | POA: Diagnosis not present

## 2024-07-20 DIAGNOSIS — J302 Other seasonal allergic rhinitis: Secondary | ICD-10-CM

## 2024-07-20 MED ORDER — PREDNISONE 20 MG PO TABS
ORAL_TABLET | ORAL | 0 refills | Status: DC
  Filled 2024-07-20: qty 4, 2d supply, fill #0

## 2024-07-20 MED ORDER — MONTELUKAST SODIUM 10 MG PO TABS
10.0000 mg | ORAL_TABLET | Freq: Every day | ORAL | 0 refills | Status: DC
Start: 2024-07-20 — End: 2024-08-19
  Filled 2024-07-20: qty 30, 30d supply, fill #0

## 2024-07-20 MED ORDER — CEPHALEXIN 500 MG PO CAPS
500.0000 mg | ORAL_CAPSULE | Freq: Two times a day (BID) | ORAL | 0 refills | Status: AC
  Filled 2024-07-20: qty 14, 7d supply, fill #0

## 2024-07-20 NOTE — Patient Instructions (Addendum)
 1. Seasonal and perennial allergic rhinitis - Testing today showed: grasses, ragweed, weeds, trees, indoor molds, dust mites, cat, and horse - Copy of test results provided.  - Avoidance measures provided. - Continue with carbinoxamine  4mg  twice daily.  - Continue Ryaltris two sprays per nostril twice daily. - You can use an extra dose of the antihistamine, if needed, for breakthrough symptoms.  - Consider nasal saline rinses 1-2 times daily to remove allergens from the nasal cavities as well as help with mucous clearance (this is especially helpful to do before the nasal sprays are given) - We will start allergy  shots as a means of long-term control. - Allergy  shots re-train and reset the immune system to ignore environmental allergens and decrease the resulting immune response to those allergens (sneezing, itchy watery eyes, runny nose, nasal congestion, etc).    - Allergy  shots improve symptoms in 75-85% of patients.  - We can discuss more at the next appointment if the medications are not working for you. - Come to [place] on Friday September 26th for rush immunotherapy.   DAY BEFORE IMMUNOTHERAPY: Take one tablet of Zyrtec  or Claritin 10 mg or Allegra  180 mg (over the counter) Pepcid  20 mg in the morning and evening (over the counter)  Singulair  10 mg in the morning (5mg  if 6-14, 4mg  if <6) Prednisone  40 mg (2 pills) in the morning    DAY OF IMMUNOTHERAPY: Take one tablet of Zyrtec  or Claritin 10 mg or Allegra  180 mg (over the counter) Pepcid  20 mg in the morning and evening (over the counter)  Singulair  10 mg in the morning  (5mg  if 6-14, 4mg  if <6yo) Prednisone  40 mg (2 pills) in the morning    Bring your Epipen  with you to the clinic! Please arrive no later than 8:00am for your rush appointment Make sure to bring food, and activities to keep you occupied for the day  2. Chronic autoimmune urticaria  - Continue with Xolair  monthly as you are doing.  - This was restarted  today.  - These are under good control.   3. GERD - Continue with famotidine  40mg  twice daily.   4. Return in about 17 days (around 08/06/2024) for RUSH IMMUNOTHERAPY. You can have the follow up appointment with Dr. Iva or a Nurse Practicioner (our Nurse Practitioners are excellent and always have Physician oversight!).    Please inform us  of any Emergency Department visits, hospitalizations, or changes in symptoms. Call us  before going to the ED for breathing or allergy  symptoms since we might be able to fit you in for a sick visit. Feel free to contact us  anytime with any questions, problems, or concerns.  It was a pleasure to see you again today!  Websites that have reliable patient information: 1. American Academy of Asthma, Allergy , and Immunology: www.aaaai.org 2. Food Allergy  Research and Education (FARE): foodallergy.org 3. Mothers of Asthmatics: http://www.asthmacommunitynetwork.org 4. [employer] of Allergy , Asthma, and Immunology: www.acaai.org      "Like" us  on Facebook and Instagram for our latest updates!      A healthy democracy works best when [employer] participate! Make sure you are registered to vote! If you have moved or changed any of your contact information, you will need to get this updated before voting! Scan the QR codes below to learn more!

## 2024-07-20 NOTE — Progress Notes (Unsigned)
 FOLLOW UP  Date of Service/Encounter:  07/20/24   Assessment:   Seasonal and perennial allergic rhinitis (trees, weeds, grasses, indoor molds, dust mites, cat, dog and cockroach) - was briefly on allergen immunotherapy and interested in restarting, therefore recommended repeat testing   Adverse food reaction (seafood) - with negative skin testing to seafood and now tolerates   Shortness of breath - diagnosed as neurogenic cough (markedly improved with decrease in stress in her life)   Chronic urticaria - well controlled with Xolair  monthly  Plan/Recommendations:   Patient Instructions  1. Seasonal and perennial allergic rhinitis - Testing today showed: grasses, ragweed, weeds, trees, indoor molds, dust mites, cat, and horse - Copy of test results provided.  - Avoidance measures provided. - Continue with carbinoxamine  4mg  twice daily.  - Continue Ryaltris two sprays per nostril twice daily. - You can use an extra dose of the antihistamine, if needed, for breakthrough symptoms.  - Consider nasal saline rinses 1-2 times daily to remove allergens from the nasal cavities as well as help with mucous clearance (this is especially helpful to do before the nasal sprays are given) - We will start allergy  shots as a means of long-term control. - Allergy  shots re-train and reset the immune system to ignore environmental allergens and decrease the resulting immune response to those allergens (sneezing, itchy watery eyes, runny nose, nasal congestion, etc).    - Allergy  shots improve symptoms in 75-85% of patients.  - We can discuss more at the next appointment if the medications are not working for you. - Come to [place] on Friday September 26th for rush immunotherapy.   DAY BEFORE IMMUNOTHERAPY: Take one tablet of Zyrtec  or Claritin 10 mg or Allegra  180 mg (over the counter) Pepcid  20 mg in the morning and evening (over the counter)  Singulair  10 mg in the morning (5mg  if 6-14, 4mg   if <6) Prednisone  40 mg (2 pills) in the morning    DAY OF IMMUNOTHERAPY: Take one tablet of Zyrtec  or Claritin 10 mg or Allegra  180 mg (over the counter) Pepcid  20 mg in the morning and evening (over the counter)  Singulair  10 mg in the morning  (5mg  if 6-14, 4mg  if <6yo) Prednisone  40 mg (2 pills) in the morning    Bring your Epipen  with you to the clinic! Please arrive no later than 8:00am for your rush appointment Make sure to bring food, and activities to keep you occupied for the day  2. Chronic autoimmune urticaria  - Continue with Xolair  monthly as you are doing.  - This was restarted today.  - These are under good control.   3. GERD - Continue with famotidine  40mg  twice daily.   4. Return in about 17 days (around 08/06/2024) for RUSH IMMUNOTHERAPY. You can have the follow up appointment with Dr. Iva or a Nurse Practicioner (our Nurse Practitioners are excellent and always have Physician oversight!).    Please inform us  of any Emergency Department visits, hospitalizations, or changes in symptoms. Call us  before going to the ED for breathing or allergy  symptoms since we might be able to fit you in for a sick visit. Feel free to contact us  anytime with any questions, problems, or concerns.  It was a pleasure to see you again today!  Websites that have reliable patient information: 1. American Academy of Asthma, Allergy , and Immunology: www.aaaai.org 2. Food Allergy  Research and Education (FARE): foodallergy.org 3. Mothers of Asthmatics: http://www.asthmacommunitynetwork.org 4. American College of Allergy , Asthma, and Immunology: www.acaai.org      "  Like" us  on Facebook and Instagram for our latest updates!      A healthy democracy works best when [employer] participate! Make sure you are registered to vote! If you have moved or changed any of your contact information, you will need to get this updated before voting! Scan the QR codes below to learn more!             Subjective:   Emma Larsen is a 53 y.o. female presenting today for follow up of  Chief Complaint  Patient presents with   Allergy  Testing    1-55    Emma Larsen has a history of the following: Patient Active Problem List   Diagnosis Date Noted   Trigger finger of left thumb 12/09/2023   Impacted cerumen of both ears 04/30/2021   Tendonitis of wrist, right 03/07/2021   Constipation 12/02/2019   Allergy  to environmental factors 12/02/2019   Ear itching 11/09/2019   Globus sensation 11/09/2019   Schatzki's ring 10/19/2019   Chronic cough 09/08/2019   Dysphonia 09/08/2019   Laryngospasm 09/08/2019   Sinusitis, acute 07/08/2018   Simple chronic bronchitis (HCC) 06/03/2018   Seasonal and perennial allergic rhinitis 02/19/2018   SOB (shortness of breath) 02/16/2018   Adverse food reaction 02/16/2018   Chronic rhinitis 02/16/2018   Gastroesophageal reflux disease 02/16/2018    History obtained from: chart review and patient.  Discussed the use of AI scribe software for clinical note transcription with the patient and/or guardian, who gave verbal consent to proceed.  [name] is a 53 y.o. female presenting for skin testing. She was last seen on August 2024. We could not do testing because her insurance company does not cover testing on the same day as a New Patient visit. She has been off of all antihistamines 3 days in anticipation of the testing.   At that last visit with a nurse practitioner, she was continued on albuterol  2 puffs every 4 hours as needed.  For her allergic rhinitis, she was started on Flonase  and Astelin  as well as Mucinex  and saline.  She continue with carbinoxamine  4 mg twice a day as needed.  She already had this appointment for allergy  testing today.  After her visit, she did contact me for antibiotics when symptomatic treatment did not help.  She then contacted me for a yeast infection.  We sent in Diflucan .  Otherwise, there have  been no changes to her past medical history, surgical history, family history, or social history.    Review of systems otherwise negative other than that mentioned in the HPI.    Objective:   Last menstrual period 11/12/2021. There is no height or weight on file to calculate BMI.    Physical exam deferred since this was a skin testing appointment only.   Diagnostic studies: {Blank single:19197::none,deferred due to recent antihistamine use,labs sent instead, }  Spirometry: {Blank single:19197::results normal (FEV1: ***%, FVC: ***%, FEV1/FVC: ***%),results abnormal (FEV1: ***%, FVC: ***%, FEV1/FVC: ***%)}.    {Blank single:19197::Spirometry consistent with mild obstructive disease,Spirometry consistent with moderate obstructive disease,Spirometry consistent with severe obstructive disease,Spirometry consistent with possible restrictive disease,Spirometry consistent with mixed obstructive and restrictive disease,Spirometry uninterpretable due to technique,Spirometry consistent with normal pattern}. {Blank single:19197::Albuterol /Atrovent  nebulizer,Xopenex/Atrovent  nebulizer,Albuterol  nebulizer,Albuterol  four puffs via MDI,Xopenex four puffs via MDI} treatment given in clinic with {Blank single:19197::significant improvement in FEV1 per ATS criteria,significant improvement in FVC per ATS criteria,significant improvement in FEV1 and FVC per ATS criteria,improvement in FEV1, but not significant per ATS criteria,improvement in  FVC, but not significant per ATS criteria,improvement in FEV1 and FVC, but not significant per ATS criteria,no improvement}.  Allergy  Studies: {Blank single:19197::none,labs sent instead, }   Airborne Adult Perc - 07/20/24 1501     Time Antigen Placed 1502    Allergen Manufacturer Jestine    Location Back    Number of Test 55    1. Control-Buffer 50% Glycerol Negative    2. Control-Histamine 2+    3. Bahia 2+     4. [place] Negative    5. Johnson 2+    6. [place]  Blue 3+    7. Meadow Fescue 3+    8. Perennial Rye 3+    9. Timothy 3+    10. Ragweed Mix 3+    11. Cocklebur 2+    12. Plantain,  English 2+    13. Baccharis 2+    14. Dog Fennel 2+    15. Russian Thistle 3+    16. Lamb's Quarters 2+    17. Sheep Sorrell 2+    18. Rough Pigweed Negative    19. Marsh Elder, Rough Negative    20. Mugwort, Common Negative    21. Box, Elder Negative    22. Cedar, red 3+    23. Sweet Gum 2+    24. Pecan Pollen 2+    25. Pine Mix Negative    26. Walnut, Black Pollen 2+    27. Red Mulberry 2+    28. Ash Mix 2+    29. Birch Mix 2+    30. Beech American Negative    31. Cottonwood, [place] 2+    32. Hickory, White 3+    33. Maple Mix 3+    34. Oak, [place] Mix 3+    35. Sycamore Eastern Negative    36. Alternaria Alternata Negative    37. Cladosporium Herbarum Negative    38. Aspergillus Mix Negative    39. Penicillium Mix Negative    40. Bipolaris Sorokiniana (Helminthosporium) Negative    41. Drechslera Spicifera (Curvularia) Negative    42. Mucor Plumbeus Negative    43. Fusarium Moniliforme Negative    44. Aureobasidium Pullulans (pullulara) Negative    45. Rhizopus Oryzae Negative    46. Botrytis Cinera Negative    47. Epicoccum Nigrum Negative    48. Phoma Betae Negative    49. Dust Mite Mix 4+    50. Cat Hair 10,000 BAU/ml 4+    51.  Dog Epithelia Negative    52. Mixed Feathers Negative    53. Horse Epithelia 3+    54. Cockroach, German Negative    55. Tobacco Leaf Negative          Intradermal - 07/20/24 1539     Time Antigen Placed 0700    Allergen Manufacturer Jestine    Location Arm    Number of Test 7    Control Negative    [place] 2+    Mold 1 Negative    Mold 2 2+    Mold 3 Negative    Mold 4 Negative    Cockroach Negative          {Blank single:19197::Allergy  testing results were read and interpreted by myself, documented by clinical staff., }       Marty Shaggy, MD  [facility] 

## 2024-07-21 ENCOUNTER — Encounter: Payer: Self-pay | Admitting: Allergy & Immunology

## 2024-07-21 DIAGNOSIS — J3089 Other allergic rhinitis: Secondary | ICD-10-CM | POA: Diagnosis not present

## 2024-07-21 DIAGNOSIS — J302 Other seasonal allergic rhinitis: Secondary | ICD-10-CM | POA: Diagnosis not present

## 2024-07-21 DIAGNOSIS — J301 Allergic rhinitis due to pollen: Secondary | ICD-10-CM | POA: Diagnosis not present

## 2024-07-21 DIAGNOSIS — J3081 Allergic rhinitis due to animal (cat) (dog) hair and dander: Secondary | ICD-10-CM | POA: Diagnosis not present

## 2024-07-21 NOTE — Progress Notes (Signed)
 Aeroallergen Immunotherapy   Ordering Provider: Dr. Marty Shaggy   Patient Details  Name: Emma Larsen  MRN: [id number]  Date of Birth: January 04, 1971   Order 2 of 2   Vial Label: RW/Molds/DM   0.6 ml (Volume)  1:20 Concentration -- Ragweed Mix  0.2 ml (Volume)  1:10 Concentration -- Aspergillus mix  0.2 ml (Volume)  1:10 Concentration -- Penicillium mix  1.0 ml (Volume)   AU Concentration -- Mite Mix (DF 5,000 & DP 5,000)    2.0  ml Extract Subtotal  3.0  ml Diluent   5.0  ml Maintenance Total   Schedule:  A  Silver Vial (1:1,000,000): RUSH  Blue Vial (1:100,000): RUSH  Yellow Vial (1:10,000): RUSH  Green Vial (1:1,000): Schedule A (10 doses)  Red Vial (1:100): Schedule A (12 doses)   Special Instructions: After completion of the first Red Vial, please space to every two weeks. After completion of the second Red Vial, please space to every 4 weeks. Ok to up dose new vials at 0.1mL --> 0.3 mL --> 0.5 mL. Ok to come twice weekly, if desired, as long as there is 48 hours between injections.

## 2024-07-21 NOTE — Progress Notes (Signed)
 Aeroallergen Immunotherapy  Ordering Provider: Dr. Marty Shaggy  Patient Details Name: XIADANI DAMMAN MRN: [id number] Date of Birth: 02-16-1971  Order 1 of 2  Vial Label: G/W/T/D/H  0.3 ml (Volume)  BAU Concentration -- 7 Grass Mix* 100,000 ([place], [place], [place], Perennial Rye, RedTop, Sweet Vernal, Timothy) 0.2 ml (Volume)  1:20 Concentration -- Bahia 0.3 ml (Volume)  BAU Concentration -- [place] 10,000 0.2 ml (Volume)  1:20 Concentration -- Johnson 0.2 ml (Volume)  1:20 Concentration -- Guernsey Thistle 0.2 ml (Volume)  1:40 Concentration -- Baccharis 0.2 ml (Volume)  1:80 Concentration -- Dogfennel 0.5 ml (Volume)  1:20 Concentration -- Weed Mix* 0.5 ml (Volume)  1:20 Concentration -- Eastern 10 Tree Mix (also Sweet Gum) 0.1 ml (Volume)  1:20 Concentration -- Ash mix* 0.1 ml (Volume)  1:10 Concentration -- Birch mix* 0.2 ml (Volume)  1:10 Concentration -- Cedar, red 0.1 ml (Volume)  1:20 Concentration -- Cottonwood, Eastern* 0.1 ml (Volume)  1:10 Concentration -- Hickory* 0.1 ml (Volume)  1:20 Concentration -- Maple Mix* 0.1 ml (Volume)  1:10 Concentration -- Oak, Guinea-Bissau mix* 0.2 ml (Volume)  1:10 Concentration -- Pecan Pollen 0.2 ml (Volume)  1:20 Concentration -- Red Mulberry 0.5 ml (Volume)  1:10 Concentration -- Dog Epithelia 0.3 ml (Volume)  1:10 Concentration -- Horse Epithelia   4.6  ml Extract Subtotal 0.4  ml Diluent  5.0  ml Maintenance Total  Schedule:  A Silver Vial (1:1,000,000): RUSH Blue Vial (1:100,000): RUSH Yellow Vial (1:10,000): RUSH Green Vial (1:1,000): Schedule A (10 doses) Red Vial (1:100): Schedule A (12 doses)  Special Instructions: After completion of the first Red Vial, please space to every two weeks. After completion of the second Red Vial, please space to every 4 weeks. Ok to up dose new vials at 0.58mL --> 0.3 mL --> 0.5 mL. Ok to come twice weekly, if desired, as long as there is 48 hours between injections.

## 2024-07-21 NOTE — Progress Notes (Signed)
 VIALS MADE 07-21-24

## 2024-07-22 ENCOUNTER — Other Ambulatory Visit: Payer: Self-pay

## 2024-07-22 ENCOUNTER — Ambulatory Visit: Admitting: Podiatry

## 2024-07-22 ENCOUNTER — Encounter: Payer: Self-pay | Admitting: Podiatry

## 2024-07-22 ENCOUNTER — Ambulatory Visit (INDEPENDENT_AMBULATORY_CARE_PROVIDER_SITE_OTHER)

## 2024-07-22 ENCOUNTER — Other Ambulatory Visit (HOSPITAL_COMMUNITY): Payer: Self-pay

## 2024-07-22 DIAGNOSIS — N898 Other specified noninflammatory disorders of vagina: Secondary | ICD-10-CM | POA: Insufficient documentation

## 2024-07-22 DIAGNOSIS — M722 Plantar fascial fibromatosis: Secondary | ICD-10-CM

## 2024-07-22 DIAGNOSIS — B9689 Other specified bacterial agents as the cause of diseases classified elsewhere: Secondary | ICD-10-CM | POA: Insufficient documentation

## 2024-07-22 MED ORDER — TRIAMCINOLONE ACETONIDE 40 MG/ML IJ SUSP
40.0000 mg | Freq: Once | INTRAMUSCULAR | Status: AC
  Administered 2024-07-22: 40 mg

## 2024-07-22 MED ORDER — TRIAMCINOLONE ACETONIDE 0.5 % EX CREA
1.0000 | TOPICAL_CREAM | Freq: Three times a day (TID) | CUTANEOUS | 0 refills | Status: DC
  Filled 2024-07-22: qty 30, 10d supply, fill #0

## 2024-07-22 MED ORDER — MELOXICAM 15 MG PO TABS
15.0000 mg | ORAL_TABLET | Freq: Every day | ORAL | 3 refills | Status: AC
  Filled 2024-07-24: qty 30, 30d supply, fill #0
  Filled 2024-10-12: qty 30, 30d supply, fill #1

## 2024-07-22 MED ORDER — METHYLPREDNISOLONE 4 MG PO TBPK: ORAL_TABLET | ORAL | 0 refills | Status: DC

## 2024-07-22 NOTE — Addendum Note (Signed)
 Addended by: IVA MARTY SALTNESS on: 07/22/2024 05:26 PM   Modules accepted: Orders

## 2024-07-22 NOTE — Progress Notes (Signed)
 She presents today chief complaint of painful heels bilaterally.  She says been going on since about May or June.  States that she has to get out of bed in the mornings and 2 to to the restroom.  Has tried anti-inflammatories but no help she has tried stretching as well.  Objective: I reviewed her past medical history medications allergies surgeries and social history.  She has strong palpable pulses bilaterally she has pain on palpation medial calcaneal tubercle her left heel appears to be worse than her right heel.  Radiographs taken today demonstrate osseously mature individual with mild pes planovalgus.  Accessory os naviculare is present on the left foot.  She has a soft tissue increase in density plantar fascial calcaneal insertion site of the left foot as opposed to that on the right foot.  Assessment: Plantar fasciitis bilateral mild pes planovalgus bilateral.  Plan: Discussed etiology pathology conservative surgical therapies at this point I injected the bilateral heels starting her on a Medrol  Dosepak to be followed by meloxicam .  And placed her in plantar fascia braces.  We discussed appropriate shoe gear stretching exercise ice therapy and shoe gear modifications follow-up with me in 1 month

## 2024-07-24 ENCOUNTER — Other Ambulatory Visit (HOSPITAL_COMMUNITY): Payer: Self-pay

## 2024-07-26 ENCOUNTER — Other Ambulatory Visit (HOSPITAL_COMMUNITY): Payer: Self-pay

## 2024-07-30 ENCOUNTER — Other Ambulatory Visit (HOSPITAL_COMMUNITY): Payer: Self-pay

## 2024-07-30 ENCOUNTER — Other Ambulatory Visit: Payer: Self-pay

## 2024-07-30 DIAGNOSIS — R300 Dysuria: Secondary | ICD-10-CM | POA: Diagnosis not present

## 2024-07-30 DIAGNOSIS — B3731 Acute candidiasis of vulva and vagina: Secondary | ICD-10-CM | POA: Diagnosis not present

## 2024-07-30 MED ORDER — SULFAMETHOXAZOLE-TRIMETHOPRIM 800-160 MG PO TABS
1.0000 | ORAL_TABLET | Freq: Two times a day (BID) | ORAL | 0 refills | Status: DC
  Filled 2024-07-30: qty 6, 3d supply, fill #0

## 2024-07-30 MED ORDER — TERCONAZOLE 0.8 % VA CREA
1.0000 | TOPICAL_CREAM | Freq: Every day | VAGINAL | 0 refills | Status: DC
  Filled 2024-07-30: qty 20, 3d supply, fill #0

## 2024-08-04 ENCOUNTER — Other Ambulatory Visit (HOSPITAL_COMMUNITY): Payer: Self-pay

## 2024-08-05 ENCOUNTER — Telehealth: Payer: Self-pay | Admitting: Allergy & Immunology

## 2024-08-05 ENCOUNTER — Other Ambulatory Visit: Payer: Self-pay | Admitting: *Deleted

## 2024-08-05 NOTE — Telephone Encounter (Signed)
 Error

## 2024-08-05 NOTE — Telephone Encounter (Signed)
 PT called to reschedule [place] RUSH to [facility] - I advised would likely be towards end of October, but that we also need to reach back out to her to schedule, she thanked

## 2024-08-06 ENCOUNTER — Ambulatory Visit: Payer: Self-pay | Admitting: Allergy & Immunology

## 2024-08-06 ENCOUNTER — Other Ambulatory Visit: Payer: Self-pay

## 2024-08-06 DIAGNOSIS — J309 Allergic rhinitis, unspecified: Secondary | ICD-10-CM

## 2024-08-06 NOTE — Progress Notes (Signed)
 Specialty Pharmacy Refill Coordination Note  Emma Larsen is a 53 y.o. female assessed today regarding refills of clinic administered specialty medication(s) Omalizumab  (XOLAIR )   Clinic requested Courier to Provider Office   Delivery date: 08/10/24   Injection date: 08/17/24  Verified address: [facility] [address] [place], [place] [address]   Medication will be filled on 08/09/24.

## 2024-08-17 ENCOUNTER — Ambulatory Visit (INDEPENDENT_AMBULATORY_CARE_PROVIDER_SITE_OTHER)

## 2024-08-17 DIAGNOSIS — L501 Idiopathic urticaria: Secondary | ICD-10-CM | POA: Diagnosis not present

## 2024-08-24 ENCOUNTER — Ambulatory Visit: Admitting: Podiatry

## 2024-08-30 ENCOUNTER — Encounter (HOSPITAL_COMMUNITY): Payer: Self-pay | Admitting: Pharmacist

## 2024-08-30 ENCOUNTER — Other Ambulatory Visit (HOSPITAL_COMMUNITY): Payer: Self-pay

## 2024-08-30 ENCOUNTER — Telehealth: Payer: Self-pay

## 2024-08-30 NOTE — Telephone Encounter (Signed)
 In order to proceed with the renewal prior authorization for Nurtec, we will need clinic notes that document a recent discussion regarding the patient's migraine condition and their response to current therapy.

## 2024-08-30 NOTE — Telephone Encounter (Signed)
*  AA  Pharmacy Patient Advocate Encounter   Received notification from CoverMyMeds that prior authorization for Nurtec 75MG  dispersible tablets  is required/requested.   Insurance verification completed.   The patient is insured through [facility]   Per test claim: PA required; PA started via CoverMyMeds. KEY [id number] . Please see clinical question(s) below that I am not finding the answer to in their chart and advise.   Has the patient experienced an improvement from baseline in a validated acute treatment patient-reported outcome questionnaire (e.g., Migraine Assessment of Current Therapy [Migraine-ACT])?

## 2024-09-06 ENCOUNTER — Other Ambulatory Visit: Payer: Self-pay

## 2024-09-07 ENCOUNTER — Other Ambulatory Visit: Payer: Self-pay

## 2024-09-07 ENCOUNTER — Other Ambulatory Visit (HOSPITAL_COMMUNITY): Payer: Self-pay

## 2024-09-07 ENCOUNTER — Ambulatory Visit: Admitting: Podiatry

## 2024-09-07 MED ORDER — FLUZONE 0.5 ML IM SUSY
0.5000 mL | PREFILLED_SYRINGE | Freq: Once | INTRAMUSCULAR | 0 refills | Status: AC
  Filled 2024-09-07: qty 0.5, 1d supply, fill #0

## 2024-09-07 NOTE — Progress Notes (Signed)
 Specialty Pharmacy Refill Coordination Note  Emma Larsen is a 53 y.o. female assessed today regarding refills of clinic administered specialty medication(s) Omalizumab  (XOLAIR )   Clinic requested Courier to Provider Office   Delivery date: 09/09/24   Verified address: [facility] [address] [place], [place] [address]   Medication will be filled on: 09/08/24    Appointment 09/14/24.

## 2024-09-14 ENCOUNTER — Ambulatory Visit (INDEPENDENT_AMBULATORY_CARE_PROVIDER_SITE_OTHER)

## 2024-09-14 DIAGNOSIS — L501 Idiopathic urticaria: Secondary | ICD-10-CM | POA: Diagnosis not present
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
## 2024-09-23 ENCOUNTER — Ambulatory Visit: Admitting: Podiatry

## 2024-09-23 ENCOUNTER — Ambulatory Visit

## 2024-09-24 ENCOUNTER — Ambulatory Visit: Admitting: Allergy

## 2024-09-28 ENCOUNTER — Other Ambulatory Visit (HOSPITAL_COMMUNITY): Payer: Self-pay

## 2024-10-03 ENCOUNTER — Telehealth: Payer: Self-pay | Admitting: Allergy & Immunology

## 2024-10-03 NOTE — Telephone Encounter (Signed)
 Patient contacted me regarding worsening sinus symptoms over the weekend. I recommended that she come in for a visit. She is open to coming in at 11:30am on Monday November 24th with Anne.   I did offer her Tuesday with me, but she wanted something sooner.   Confirmed with Arlean that this is fine to add her to the schedule.   Marty Shaggy, MD [facility] 

## 2024-10-04 ENCOUNTER — Other Ambulatory Visit: Payer: Self-pay

## 2024-10-04 ENCOUNTER — Encounter: Payer: Self-pay | Admitting: Family Medicine

## 2024-10-04 ENCOUNTER — Other Ambulatory Visit (HOSPITAL_COMMUNITY): Payer: Self-pay

## 2024-10-04 ENCOUNTER — Ambulatory Visit: Admitting: Family Medicine

## 2024-10-04 VITALS — BP 124/64 | HR 81 | Temp 97.7°F | Resp 16 | Wt 148.1 lb

## 2024-10-04 DIAGNOSIS — R350 Frequency of micturition: Secondary | ICD-10-CM | POA: Diagnosis not present

## 2024-10-04 DIAGNOSIS — J019 Acute sinusitis, unspecified: Secondary | ICD-10-CM

## 2024-10-04 DIAGNOSIS — J3089 Other allergic rhinitis: Secondary | ICD-10-CM

## 2024-10-04 DIAGNOSIS — J302 Other seasonal allergic rhinitis: Secondary | ICD-10-CM

## 2024-10-04 DIAGNOSIS — B9689 Other specified bacterial agents as the cause of diseases classified elsewhere: Secondary | ICD-10-CM

## 2024-10-04 DIAGNOSIS — T7800XA Anaphylactic reaction due to unspecified food, initial encounter: Secondary | ICD-10-CM | POA: Insufficient documentation

## 2024-10-04 DIAGNOSIS — R053 Chronic cough: Secondary | ICD-10-CM

## 2024-10-04 DIAGNOSIS — T7800XD Anaphylactic reaction due to unspecified food, subsequent encounter: Secondary | ICD-10-CM

## 2024-10-04 MED ORDER — FLUCONAZOLE 150 MG PO TABS
150.0000 mg | ORAL_TABLET | Freq: Every day | ORAL | 0 refills | Status: DC
  Filled 2024-10-04: qty 1, 1d supply, fill #0

## 2024-10-04 MED ORDER — AMOXICILLIN-POT CLAVULANATE 875-125 MG PO TABS
1.0000 | ORAL_TABLET | Freq: Two times a day (BID) | ORAL | 0 refills | Status: DC
  Filled 2024-10-04: qty 20, 10d supply, fill #0

## 2024-10-04 NOTE — Progress Notes (Signed)
 Specialty Pharmacy Refill Coordination Note  In-office administered. Patient/Guardian authorizes monthly copay charge  Emma Larsen is a 53 y.o. female contacted today regarding refills of specialty medication(s) Omalizumab  (XOLAIR )  Doses on hand: 0   Injection appointment: 10/12/24  Patient requested: Courier to Provider Office   Delivery date: 10/06/24   Verified address: [facility] [address] [place] [address]  Medication will be filled on 10/05/24 .

## 2024-10-04 NOTE — Patient Instructions (Addendum)
 Acute sinusitis  Begin Augmentin  875 mg twice a day for the next 10 days  Begin nasal saline rinses followed by Flonase  nasal spray daily  Begin Mucinex  1200 mg twice a day and increase fluid intake   Shortness of breath Continue albuterol  2 puffs once every 4 hours as needed for cough or wheeze You may use albuterol  2 puffs 5 to 15 minutes before activity to decrease cough or wheeze  Allergic rhinitis Continue Flonase  2 sprays in each nostril once a day for nasal congestion Continue to azelastine  2 sprays in each nostril twice a day for runny nose Begin Mucinex  600 to 1200 mg twice a day and increase fluid intake as tolerated to thin mucus Begin saline nasal rinses as needed for nasal symptoms. Use this before any medicated nasal sprays for best result If no improvement, begin prednisone  10 mg twice a day for 4 days and once a day on the fifth day Continue allergen avoidance measures directed toward grass pollen, weed pollen, tree pollen, indoor mold, dust mite, cat, dog and cockroach as listed below Continue carbinoxamine  4 mg twice a day if needed for runny nose or itch Consider allergen immunotherapy if your symptoms not well-controlled with the treatment plan as listed above  Chronic urticaria Continue carbinoxamine  4 mg twice a day if needed for itch.  May add famotidine  20 mg once or twice a day if needed for itch Restart Xolair  injections once every 28 days and have access to an epinephrine  autoinjector sent per protocol after your allergy  testing appointment  Food allergy  Continue to avoid shellfish, especially shrimp.  In case of an allergic reaction, take cetirizine  10 mg every 12-24 hours, and if life-threatening symptoms occur, inject with AuviQ 0.3 mg.  Urinary symptoms Continue to monitor your symptoms and follow-up with your primary care provider if no improvement  Call the clinic if this treatment plan is not working well for you.    Follow up in 1 month or sooner if  needed.  Already she is already-year-old   Reducing Pollen Exposure The American Academy of Allergy , Asthma and Immunology suggests the following steps to reduce your exposure to pollen during allergy  seasons. Do not hang sheets or clothing out to dry; pollen may collect on these items. Do not mow lawns or spend time around freshly cut grass; mowing stirs up pollen. Keep windows closed at night.  Keep car windows closed while driving. Minimize morning activities outdoors, a time when pollen counts are usually at their highest. Stay indoors as much as possible when pollen counts or humidity is high and on windy days when pollen tends to remain in the air longer. Use air conditioning when possible.  Many air conditioners have filters that trap the pollen spores. Use a HEPA room air filter to remove pollen form the indoor air you breathe.  Control of Mold Allergen Mold and fungi can grow on a variety of surfaces provided certain temperature and moisture conditions exist.  Outdoor molds grow on plants, decaying vegetation and soil.  The major outdoor mold, Alternaria and Cladosporium, are found in very high numbers during hot and dry conditions.  Generally, a late Summer - Fall peak is seen for common outdoor fungal spores.  Rain will temporarily lower outdoor mold spore count, but counts rise rapidly when the rainy period ends.  The most important indoor molds are Aspergillus and Penicillium.  Dark, humid and poorly ventilated basements are ideal sites for mold growth.  The next most common sites  of mold growth are the bathroom and the kitchen.  Outdoor [employer] Use air conditioning and keep windows closed Avoid exposure to decaying vegetation. Avoid leaf raking. Avoid grain handling. Consider wearing a face mask if working in moldy areas.  Indoor Mold Control Maintain humidity below 50%. Clean washable surfaces with 5% bleach solution. Remove sources e.g. Contaminated carpets.  Control  of Dog or Cat Allergen Avoidance is the best way to manage a dog or cat allergy . If you have a dog or cat and are allergic to dog or cats, consider removing the dog or cat from the home. If you have a dog or cat but don't want to find it a new home, or if your family wants a pet even though someone in the household is allergic, here are some strategies that may help keep symptoms at bay:  Keep the pet out of your bedroom and restrict it to only a few rooms. Be advised that keeping the dog or cat in only one room will not limit the allergens to that room. Don't pet, hug or kiss the dog or cat; if you do, wash your hands with soap and water. High-efficiency particulate air (HEPA) cleaners run continuously in a bedroom or living room can reduce allergen levels over time. Regular use of a high-efficiency vacuum cleaner or a central vacuum can reduce allergen levels. Giving your dog or cat a bath at least once a week can reduce airborne allergen.   Control of Dust Mite Allergen Dust mites play a major role in allergic asthma and rhinitis. They occur in environments with high humidity wherever human skin is found. Dust mites absorb humidity from the atmosphere (ie, they do not drink) and feed on organic matter (including shed human and animal skin). Dust mites are a microscopic type of insect that you cannot see with the naked eye. High levels of dust mites have been detected from mattresses, pillows, carpets, upholstered furniture, bed covers, clothes, soft toys and any woven material. The principal allergen of the dust mite is found in its feces. A gram of dust may contain 1,000 mites and 250,000 fecal particles. Mite antigen is easily measured in the air during house cleaning activities. Dust mites do not bite and do not cause harm to humans, other than by triggering allergies/asthma.  Ways to decrease your exposure to dust mites in your home:  1. Encase mattresses, box springs and pillows with a  mite-impermeable barrier or cover  2. Wash sheets, blankets and drapes weekly in hot water (130 F) with detergent and dry them in a dryer on the hot setting.  3. Have the room cleaned frequently with a vacuum cleaner and a damp dust-mop. For carpeting or rugs, vacuuming with a vacuum cleaner equipped with a high-efficiency particulate air (HEPA) filter. The dust mite allergic individual should not be in a room which is being cleaned and should wait 1 hour after cleaning before going into the room.  4. Do not sleep on upholstered furniture (eg, couches).  5. If possible removing carpeting, upholstered furniture and drapery from the home is ideal. Horizontal blinds should be eliminated in the rooms where the person spends the most time (bedroom, study, television room). Washable vinyl, roller-type shades are optimal.  6. Remove all non-washable stuffed toys from the bedroom. Wash stuffed toys weekly like sheets and blankets above.  7. Reduce indoor humidity to less than 50%. Inexpensive humidity monitors can be purchased at most hardware stores. Do not use a humidifier  as can make the problem worse and are not recommended.  Control of Cockroach Allergen Cockroach allergen has been identified as an important cause of acute attacks of asthma, especially in urban settings.  There are fifty-five species of cockroach that exist in the [place] , however only three, the American, German and Oriental species produce allergen that can affect patients with Asthma.  Allergens can be obtained from fecal particles, egg casings and secretions from cockroaches.    Remove food sources. Reduce access to water. Seal access and entry points. Spray runways with 0.5-1% Diazinon or Chlorpyrifos Blow boric acid power under stoves and refrigerator. Place bait stations (hydramethylnon) at feeding sites.

## 2024-10-04 NOTE — Progress Notes (Signed)
 [address] [place] [address]   6tDept: [id number]  FOLLOW UP NOTE  Patient ID: Tobias JONETTA Moose, female    DOB: September 15, 1971  Age: 53 y.o. MRN: [id number] Date of Office Visit: 10/04/2024  Assessment  Chief Complaint: Follow-up (May be getting a sinus infection)  HPI JOSEPH JOHNS is a 53 year old female who presents to the clinic for evaluation of sinus issues.  She was last seen in this clinic on 07/20/2024 by Dr. Iva for evaluation shortness of breath with neurogenic cough, allergic rhinitis, chronic urticaria on Xolair , and reflux.  Discussed the use of AI scribe software for clinical note transcription with the patient, who gave verbal consent to proceed.  History of Present Illness BELLE CHARLIE is a 53 year old female who presents with sinus issues and possible urinary tract infection.  She has been experiencing sinus issues that began about 7 days ago, before leaving for a trip to [place]. Symptoms include dental pain, nasal congestion, and sinus pressure. She occasionally experiences headaches, which she attributes to jet lag, and has taken Nurtec for relief. She has previously used Augmentin  for similar issues without adverse reactions. She notes a tendency to develop yeast infections when on antibiotics and has used Diflucan  to manage this.  She reports occasional shortness of breath and continues to experience neurogenic cough which is producing some discolored mucus at this time.  She is not currently using an albuterol  inhaler.  Allergic rhinitis is reported as moderately well-controlled with nasal congestion, thick yellow postnasal drainage, and occasional sneezing.  She does report that she continues carbinoxamine  2 tablets twice a day and is currently out of Ryaltris.  She is not currently using a nasal saline rinse. Her last environmental allergy  skin testing was on 02/19/2018 was positive to tree pollen, weed pollen, grass pollen, indoor mold, dust mite, cat,  dog, and cockroach.  Chronic urticaria is reported as well-controlled with no outbreak of hives since her last visit to this clinic.  She continues Xolair  300 mg once every 4 weeks with no large or local reaction.  She reports a significant decrease in her symptoms of urticaria while continuing on Xolair  injections.  EpiPen  set is up-to-date.  Reflux is reported as well-controlled with heartburn as the main symptom.  She denies vomiting.  She continues famotidine  twice a day with relief of symptoms.  She reports symptoms suggestive of a urinary tract infection, including cloudy urine and increased frequency of urination.   Her current medications are listed in the chart.   Drug Allergies:  Allergies  Allergen Reactions   Sulfa  Antibiotics Anaphylaxis    Physical Exam: BP 124/64 (BP Location: Left Arm, Patient Position: Sitting, Cuff Size: Normal)   Pulse 81   Temp 97.7 F (36.5 C) (Temporal)   Resp 16   Wt 148 lb 1.6 oz (67.2 kg)   LMP 11/12/2021 (Approximate) Comment: irregular  SpO2 99%   BMI 27.09 kg/m    Physical Exam Vitals reviewed.  Constitutional:      Appearance: Normal appearance.  HENT:     Head: Normocephalic and atraumatic.     Right Ear: Tympanic membrane normal.     Left Ear: Tympanic membrane normal.     Nose:     Comments: Bilateral nares slightly erythematous with thin clear nasal drainage noted.  Pharynx normal.  Ears normal.  Eyes normal.    Mouth/Throat:     Pharynx: Oropharynx is clear.  Eyes:     Conjunctiva/sclera: Conjunctivae normal.  Cardiovascular:     Rate and Rhythm: Normal rate and regular rhythm.     Heart sounds: Normal heart sounds. No murmur heard. Pulmonary:     Effort: Pulmonary effort is normal.     Breath sounds: Normal breath sounds.     Comments: Lungs clear to auscultation Musculoskeletal:        General: Normal range of motion.     Cervical back: Normal range of motion and neck supple.  Skin:    General: Skin is warm and  dry.  Neurological:     Mental Status: She is alert and oriented to person, place, and time.  Psychiatric:        Mood and Affect: Mood normal.        Behavior: Behavior normal.        Thought Content: Thought content normal.        Judgment: Judgment normal.     Assessment and Plan: 1. Acute bacterial sinusitis   2. Seasonal and perennial allergic rhinitis   3. Chronic cough   4. Increased urinary frequency   5. Allergy  with anaphylaxis due to food     Meds ordered this encounter  Medications   amoxicillin -clavulanate (AUGMENTIN ) 875-125 MG tablet    Sig: Take 1 tablet by mouth 2 (two) times daily.    Dispense:  20 tablet    Refill:  0   fluconazole  (DIFLUCAN ) 150 MG tablet    Sig: Take 1 tablet (150 mg total) by mouth daily.    Dispense:  1 tablet    Refill:  0    Patient Instructions  Acute sinusitis  Begin Augmentin  875 mg twice a day for the next 10 days  Begin nasal saline rinses followed by Flonase  nasal spray daily  Begin Mucinex  1200 mg twice a day and increase fluid intake   Shortness of breath Continue albuterol  2 puffs once every 4 hours as needed for cough or wheeze You may use albuterol  2 puffs 5 to 15 minutes before activity to decrease cough or wheeze  Allergic rhinitis Continue Flonase  2 sprays in each nostril once a day for nasal congestion Continue to azelastine  2 sprays in each nostril twice a day for runny nose Begin Mucinex  600 to 1200 mg twice a day and increase fluid intake as tolerated to thin mucus Begin saline nasal rinses as needed for nasal symptoms. Use this before any medicated nasal sprays for best result If no improvement, begin prednisone  10 mg twice a day for 4 days and once a day on the fifth day Continue allergen avoidance measures directed toward grass pollen, weed pollen, tree pollen, indoor mold, dust mite, cat, dog and cockroach as listed below Continue carbinoxamine  4 mg twice a day if needed for runny nose or itch Consider  allergen immunotherapy if your symptoms not well-controlled with the treatment plan as listed above  Chronic urticaria Continue carbinoxamine  4 mg twice a day if needed for itch.  May add famotidine  20 mg once or twice a day if needed for itch Restart Xolair  injections once every 28 days and have access to an epinephrine  autoinjector sent per protocol after your allergy  testing appointment  Food allergy  Continue to avoid shellfish, especially shrimp.  In case of an allergic reaction, take cetirizine  10 mg every 12-24 hours, and if life-threatening symptoms occur, inject with AuviQ 0.3 mg.  Urinary symptoms Continue to monitor your symptoms and follow-up with your primary care provider if no improvement  Call the clinic if this treatment  plan is not working well for you.    Follow up in 1 month or sooner if needed.  Already she is already-year-old   Return in about 4 weeks (around 11/01/2024), or if symptoms worsen or fail to improve.    Thank you for the opportunity to care for this patient.  Please do not hesitate to contact me with questions.  Arlean Mutter, FNP [facility] 

## 2024-10-05 ENCOUNTER — Other Ambulatory Visit: Payer: Self-pay

## 2024-10-12 ENCOUNTER — Other Ambulatory Visit: Payer: Self-pay

## 2024-10-12 ENCOUNTER — Encounter (HOSPITAL_BASED_OUTPATIENT_CLINIC_OR_DEPARTMENT_OTHER): Payer: Self-pay | Admitting: Orthopedic Surgery

## 2024-10-12 ENCOUNTER — Ambulatory Visit

## 2024-10-12 DIAGNOSIS — L501 Idiopathic urticaria: Secondary | ICD-10-CM | POA: Diagnosis not present

## 2024-10-13 ENCOUNTER — Other Ambulatory Visit (HOSPITAL_COMMUNITY): Payer: Self-pay

## 2024-10-13 ENCOUNTER — Other Ambulatory Visit: Payer: Self-pay

## 2024-10-14 ENCOUNTER — Ambulatory Visit: Admitting: Podiatry

## 2024-10-18 NOTE — Progress Notes (Signed)

## 2024-10-18 NOTE — Anesthesia Preprocedure Evaluation (Addendum)
 Anesthesia Evaluation    Reviewed: Allergy  & Precautions, Patient's Chart, lab work & pertinent test results  Airway        Dental   Pulmonary asthma           Cardiovascular      Neuro/Psych  Headaches  Neuromuscular disease    GI/Hepatic ,GERD  ,,  Endo/Other    Renal/GU      Musculoskeletal   Abdominal   Peds  Hematology   Anesthesia Other Findings   Reproductive/Obstetrics                              Anesthesia Physical Anesthesia Plan  ASA: 2  Anesthesia Plan: MAC   Post-op Pain Management: Tylenol  PO (pre-op)* and Precedex   Induction:   PONV Risk Score and Plan: 2 and Propofol  infusion and Treatment may vary due to age or medical condition  Airway Management Planned: Natural Airway and Nasal Cannula  Additional Equipment: None  Intra-op Plan:   Post-operative Plan:   Informed Consent:      Dental advisory given  Plan Discussed with: CRNA  Anesthesia Plan Comments:          Anesthesia Quick Evaluation

## 2024-10-19 ENCOUNTER — Encounter (HOSPITAL_BASED_OUTPATIENT_CLINIC_OR_DEPARTMENT_OTHER): Payer: Self-pay | Admitting: Anesthesiology

## 2024-10-19 ENCOUNTER — Encounter (HOSPITAL_BASED_OUTPATIENT_CLINIC_OR_DEPARTMENT_OTHER): Admission: RE | Payer: Self-pay | Source: Home / Self Care

## 2024-10-19 ENCOUNTER — Ambulatory Visit (HOSPITAL_BASED_OUTPATIENT_CLINIC_OR_DEPARTMENT_OTHER): Admission: RE | Admit: 2024-10-19 | Source: Home / Self Care | Admitting: Orthopedic Surgery

## 2024-10-19 ENCOUNTER — Other Ambulatory Visit (HOSPITAL_COMMUNITY): Payer: Self-pay

## 2024-10-19 ENCOUNTER — Other Ambulatory Visit: Payer: Self-pay

## 2024-10-19 DIAGNOSIS — Z01818 Encounter for other preprocedural examination: Secondary | ICD-10-CM

## 2024-10-19 SURGERY — RELEASE, A1 PULLEY, FOR TRIGGER FINGER
Anesthesia: Choice | Laterality: Right

## 2024-10-19 NOTE — Progress Notes (Signed)
 Patient called in at 0445am.  Patient advised that she will be unable to come in this morning and needs to reschedule her surgery due to her being at [facility] all night with her son (suicidal ideation).  She advised that she will call surgeon office to reschedule.  Note left on Tammy's desk in OR to advise.

## 2024-10-20 ENCOUNTER — Ambulatory Visit: Attending: Family | Admitting: Pharmacist

## 2024-10-20 DIAGNOSIS — Z7189 Other specified counseling: Secondary | ICD-10-CM

## 2024-10-20 NOTE — Progress Notes (Signed)
   S: Patient presents for review of their specialty medication therapy.  Patient is currently taking Xolair for urticaria. Patient is managed by Dr. Dellis Anes for this.   Adherence: confirms  Efficacy: reports the medication works well for her.   Dosing: Give subcutaneously.  Can be dosed every 2 or 4 weeks based on baseline serum IgE levels and body weight.  Chronic idiopathic urticaria: SubQ: 150 or 300 mg every 4 weeks. Dosing is not dependent on serum IgE (free or total) level or body weight.    Dose adjustments: Renal: no dose adjustments  Hepatic: no dose adjustments  Toxicity: Severe hypersensitivity reaction or anaphylaxis: Discontinue treatment. Fever, arthralgia, and rash: Discontinue treatment if this constellation of symptoms occurs.  Drug-drug interactions: none   Monitoring: CV effects: none Eosinophilia and vasculitis: none Fever/arthralgia/rash: none Hypersensitivity/Anaphylaxis: none Malignant neoplasms: none Pulmonary function tests (for asthma): none   O:     Lab Results  Component Value Date   WBC 8.8 02/18/2023   HGB 15.3 (H) 02/18/2023   HCT 46.3 (H) 02/18/2023   MCV 88.4 02/18/2023   PLT 239 02/18/2023      Chemistry      Component Value Date/Time   NA 140 02/18/2023 2112   NA 142 04/12/2021 1649   K 3.5 02/18/2023 2112   CL 104 02/18/2023 2112   CO2 26 02/18/2023 2112   BUN 14 02/18/2023 2112   BUN 9 04/12/2021 1649   CREATININE 0.95 02/18/2023 2112      Component Value Date/Time   CALCIUM 9.9 02/18/2023 2112   ALKPHOS 65 02/18/2023 2112   AST 21 02/18/2023 2112   ALT 17 02/18/2023 2112   BILITOT 0.6 02/18/2023 2112   BILITOT <0.2 04/12/2021 1649       A/P: 1. Medication review: patient currently on Xolair for urticaria. Reviewed the medication with the patient, including the following: Xolair, omalizumab, is a novel IgE blocker.  It appears to reduce rates of hospitalizations, ER visits and unscheduled physician visits due  asthma exacerbations when added to standard therapy.  Studies also show a reduction in steroid requirements and improvement in quality of life.  Patient educated on purpose, proper use and potential adverse effects of Xolair.  Following instruction patient verbalized understanding. Patient should always have an EpiPen readily available in the event of anaphylaxis. SubQ: For SubQ injection only; doses >150 mg should be divided over more than one injection site (eg, 225 mg or 300 mg administered as two injections, 375 mg administered as three injections); each injection site should be separated by >=1 inch. Do not inject into moles, scars, bruises, tender areas, or broken skin. Injections may take 5 to 10 seconds to administer (solution is slightly viscous). Administer only under direct medical supervision and observe patient for 2 hours after the first 3 injections and 30 minutes after subsequent injections Angela Nevin 2015) or in accordance with individual institution policies and procedures.No recommendations for any changes at this time.   Butch Penny, PharmD, P[place]y [place], CPP Clinical Pharmacist [facility] [phone]

## 2024-10-25 DIAGNOSIS — F4320 Adjustment disorder, unspecified: Secondary | ICD-10-CM | POA: Diagnosis not present

## 2024-10-26 ENCOUNTER — Other Ambulatory Visit: Payer: Self-pay

## 2024-10-26 NOTE — Progress Notes (Signed)
 Specialty Pharmacy Refill Coordination Note  Emma Larsen is a 53 y.o. female assessed today regarding refills of clinic administered specialty medication(s) Omalizumab  (XOLAIR )   Clinic requested Courier to Provider Office   Delivery date: 11/01/24   Verified address: [facility] [address] [place] [address]   Medication will be filled on: 10/29/24  Copay: $0.00 Appointment: 12.30.25

## 2024-10-28 ENCOUNTER — Ambulatory Visit: Admitting: Podiatry

## 2024-10-29 ENCOUNTER — Other Ambulatory Visit: Payer: Self-pay

## 2024-10-30 ENCOUNTER — Other Ambulatory Visit (HOSPITAL_COMMUNITY): Payer: Self-pay

## 2024-11-02 ENCOUNTER — Ambulatory Visit: Admitting: Podiatry

## 2024-11-09 ENCOUNTER — Ambulatory Visit

## 2024-11-10 ENCOUNTER — Ambulatory Visit (INDEPENDENT_AMBULATORY_CARE_PROVIDER_SITE_OTHER)

## 2024-11-10 ENCOUNTER — Other Ambulatory Visit (HOSPITAL_COMMUNITY): Payer: Self-pay

## 2024-11-10 ENCOUNTER — Ambulatory Visit

## 2024-11-10 DIAGNOSIS — L501 Idiopathic urticaria: Secondary | ICD-10-CM

## 2024-11-10 DIAGNOSIS — J302 Other seasonal allergic rhinitis: Secondary | ICD-10-CM

## 2024-11-10 MED ORDER — PREDNISONE 20 MG PO TABS
20.0000 mg | ORAL_TABLET | Freq: Every day | ORAL | 0 refills | Status: AC
  Filled 2024-11-10: qty 4, 4d supply, fill #0

## 2024-11-10 MED ORDER — FAMOTIDINE 40 MG PO TABS
40.0000 mg | ORAL_TABLET | Freq: Two times a day (BID) | ORAL | 1 refills | Status: AC
  Filled 2024-11-10: qty 142, 71d supply, fill #0
  Filled 2024-11-10: qty 38, 19d supply, fill #0

## 2024-11-10 MED ORDER — MONTELUKAST SODIUM 10 MG PO TABS
10.0000 mg | ORAL_TABLET | Freq: Every day | ORAL | 0 refills | Status: AC
  Filled 2024-11-10: qty 10, 10d supply, fill #0

## 2024-11-10 MED ORDER — CETIRIZINE HCL 10 MG PO TABS
10.0000 mg | ORAL_TABLET | Freq: Every day | ORAL | 5 refills | Status: DC
  Filled 2024-11-10: qty 30, 30d supply, fill #0
  Filled 2024-12-03 (×2): qty 30, 30d supply, fill #1

## 2024-11-12 ENCOUNTER — Ambulatory Visit: Admitting: Internal Medicine

## 2024-11-12 VITALS — BP 128/84 | HR 88 | Resp 16

## 2024-11-12 DIAGNOSIS — L501 Idiopathic urticaria: Secondary | ICD-10-CM

## 2024-11-12 MED ORDER — RYALTRIS 665-25 MCG/ACT NA SUSP: 2.0000 | Freq: Two times a day (BID) | NASAL | 5 refills | Status: AC

## 2024-11-12 NOTE — Progress Notes (Signed)
 "  RAPID DESENSITIZATION Note  RE: Emma Larsen MRN: [id number] DOB: 1971/02/08 Date of Office Visit: 11/12/2024  Subjective:  Patient presents today for rapid desensitization.  Interval History: Patient has not been ill, she has taken all premedications as per protocol.  Recent/Current History: Pulmonary disease: no Cardiac disease: no Respiratory infection: no Rash: no Itch: no Swelling: no Cough: yes; chronic cough from post nasal drainage. Shortness of breath: no Runny/stuffy nose: yes; chronic allergy  symptoms that she hopes to improve  Itchy eyes: no Beta-blocker use: no  Patient/guardian was informed of the procedure with verbalized understanding of the risk of anaphylaxis. Consent has been signed.   Medication List:  Current Outpatient Medications  Medication Sig Dispense Refill   amoxicillin -clavulanate (AUGMENTIN ) 875-125 MG tablet Take 1 tablet by mouth 2 (two) times daily. 20 tablet 0   azelastine  (ASTELIN ) 0.1 % nasal spray Place into both nostrils 2 (two) times daily. Use in each nostril as directed     busPIRone (BUSPAR) 5 MG tablet TAKE 1 TABLET BY MOUTH EVERY DAY IN THE MORNING     Carbinoxamine  Maleate 4 MG TABS Take 2 tablets (8 mg total) by mouth in the morning and at bedtime. 120 tablet 1   cetirizine  (ZYRTEC ) 10 MG tablet Take 1 tablet (10 mg total) by mouth daily. 30 tablet 5   EPINEPHrine  (AUVI-Q ) 0.3 mg/0.3 mL IJ SOAJ injection Use as directed for severe allergic reaction 4 each 1   EPINEPHrine  (EPIPEN  2-PAK) 0.3 mg/0.3 mL IJ SOAJ injection Inject 0.3 mg into the muscle as needed for anaphylaxis. 2 each 1   famotidine  (PEPCID ) 40 MG tablet Take 1 tablet (40 mg total) by mouth 2 (two) times daily. 180 tablet 1   ipratropium (ATROVENT ) 0.06 % nasal spray Place 2 sprays into both nostrils 3 (three) times daily. 15 mL 5   meloxicam  (MOBIC ) 15 MG tablet Take 1 tablet (15 mg total) by mouth daily. 30 tablet 3   montelukast  (SINGULAIR ) 10 MG tablet Take 1  tablet (10 mg total) by mouth at bedtime. 10 tablet 0   Multiple Vitamins-Calcium (ONE-A-DAY WOMENS FORMULA) TABS TK 1 T PO QD     NURTEC 75 MG TBDP Once every other day as needed 30 tablet 2   omalizumab  (XOLAIR ) 150 MG/ML prefilled syringe Inject 300 mg into the skin every 28 (twenty-eight) days. 2 mL 11   predniSONE  (DELTASONE ) 20 MG tablet Take 40 mg (2 tablets) the morning before and 40 mg (2 tablets) the morning of [facility] as directed. 4 tablet 0   Rimegepant Sulfate  (NURTEC) 75 MG TBDP Place 1 tablet (75 mg total) under the tongue every other day as needed. 30 tablet 2   Rimegepant Sulfate  (NURTEC) 75 MG TBDP Place 1 tablet (75 mg total) under the tongue daily as needed. 8 tablet 0   Current Facility-Administered Medications  Medication Dose Route Frequency Provider Last Rate Last Admin   omalizumab  (XOLAIR ) prefilled syringe 300 mg  300 mg Subcutaneous Q28 days Gallagher, Joel Louis, MD   300 mg at 11/10/24 1425   Allergies: Allergies[1] I reviewed her past medical history, social history, family history, and environmental history and no significant changes have been reported from her previous visit.  ROS: Negative except as per HPI.  Objective: BP 138/88   Pulse 90   Resp 16   LMP 11/12/2021 Comment: irregular  SpO2 98%  There is no height or weight on file to calculate BMI.  Pre Physical Exam: GEN: alert, well  developed HEENT: clear conjunctiva, nose with mild inferior turbinate hypertrophy, pink nasal mucosa, + clear rhinorrhea, + cobblestoning HEART: regular rate and rhythm, no murmur LUNGS: clear to auscultation bilaterally, no coughing, unlabored respiration SKIN: no rashes or lesions   Post Physical Exam: GEN: alert, well developed HEENT: clear conjunctiva, nose with mild inferior turbinate hypertrophy, pink nasal mucosa, + clear rhinorrhea, + cobblestoning HEART: regular rate and rhythm, no murmur LUNGS: clear to auscultation bilaterally, no coughing,  unlabored respiration SKIN: one injection site with quarter sized hive   Post Physical Exam:  PROCEDURES:  Step 1:  0.44ml - 1:10,000 dilution (silver vial) Step 2:  0.66ml - 1:10,000 dilution (silver vial) Step 3: 0.37ml - 1:1,000 dilution (green vial)  Step 4: 0.30ml - 1:1,000 dilution (green vial)  Step 5: 0.68ml - 1:100 dilution (blue vial) Step 6: 0.58ml - 1:100 dilution (blue vial) Step 7: 0.10ml - 1:100 dilution (blue vial) Step 8: 0.21ml - 1:100 dilution (blue vial)  Patient was observed for 2 hours after the last dose.   Procedure started at 829 AM  Procedure ended at 239 PM   ASSESSMENT/PLAN:   Patient has tolerated the rapid desensitization protocol.  Next appointment, next Friday on 1/9:  Start at 0.99ml of 1:10 dilution (yellow vial) and build up per protocol.     [1] No Known Allergies  "

## 2024-11-16 ENCOUNTER — Other Ambulatory Visit (HOSPITAL_COMMUNITY): Payer: Self-pay
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
## 2024-11-18 ENCOUNTER — Other Ambulatory Visit: Payer: Self-pay

## 2024-11-23 ENCOUNTER — Ambulatory Visit (HOSPITAL_BASED_OUTPATIENT_CLINIC_OR_DEPARTMENT_OTHER): Admission: RE | Admit: 2024-11-23 | Admitting: Orthopedic Surgery

## 2024-11-23 ENCOUNTER — Encounter (HOSPITAL_BASED_OUTPATIENT_CLINIC_OR_DEPARTMENT_OTHER): Admission: RE | Payer: Self-pay | Source: Home / Self Care

## 2024-11-23 SURGERY — RELEASE, A1 PULLEY, FOR TRIGGER FINGER
Anesthesia: Choice | Laterality: Left

## 2024-11-25 ENCOUNTER — Ambulatory Visit

## 2024-11-25 DIAGNOSIS — J302 Other seasonal allergic rhinitis: Secondary | ICD-10-CM | POA: Diagnosis not present

## 2024-11-29 ENCOUNTER — Encounter (HOSPITAL_BASED_OUTPATIENT_CLINIC_OR_DEPARTMENT_OTHER): Payer: Self-pay | Admitting: Orthopedic Surgery

## 2024-11-29 ENCOUNTER — Other Ambulatory Visit: Payer: Self-pay

## 2024-11-29 ENCOUNTER — Other Ambulatory Visit (HOSPITAL_COMMUNITY): Payer: Self-pay

## 2024-11-29 NOTE — Progress Notes (Signed)
 Specialty Pharmacy Refill Coordination Note  Emma Larsen is a 54 y.o. female contacted today regarding refills of specialty medication(s) Omalizumab  (XOLAIR )   Patient requested Courier to Provider Office   Delivery date: 12/02/24   Verified address: [facility] [id number] [place] [address]   Medication will be filled on: 12/01/24     $0.00 with copay card on file

## 2024-12-01 ENCOUNTER — Other Ambulatory Visit: Payer: Self-pay

## 2024-12-03 ENCOUNTER — Other Ambulatory Visit (HOSPITAL_COMMUNITY): Payer: Self-pay

## 2024-12-03 ENCOUNTER — Other Ambulatory Visit: Payer: Self-pay

## 2024-12-03 ENCOUNTER — Ambulatory Visit (INDEPENDENT_AMBULATORY_CARE_PROVIDER_SITE_OTHER)

## 2024-12-03 DIAGNOSIS — J302 Other seasonal allergic rhinitis: Secondary | ICD-10-CM | POA: Diagnosis not present

## 2024-12-03 NOTE — Progress Notes (Signed)

## 2024-12-07 ENCOUNTER — Ambulatory Visit (HOSPITAL_BASED_OUTPATIENT_CLINIC_OR_DEPARTMENT_OTHER): Admission: RE | Admit: 2024-12-07 | Source: Home / Self Care | Admitting: Orthopedic Surgery

## 2024-12-07 ENCOUNTER — Encounter (HOSPITAL_BASED_OUTPATIENT_CLINIC_OR_DEPARTMENT_OTHER): Admission: RE | Payer: Self-pay | Source: Home / Self Care

## 2024-12-08 ENCOUNTER — Ambulatory Visit

## 2024-12-09 ENCOUNTER — Ambulatory Visit: Admitting: Family Medicine

## 2024-12-09 ENCOUNTER — Other Ambulatory Visit: Payer: Self-pay

## 2024-12-09 ENCOUNTER — Other Ambulatory Visit (HOSPITAL_COMMUNITY): Payer: Self-pay

## 2024-12-09 ENCOUNTER — Encounter: Payer: Self-pay | Admitting: Family Medicine

## 2024-12-09 VITALS — BP 130/100 | HR 87 | Temp 98.1°F | Ht 62.0 in | Wt 154.6 lb

## 2024-12-09 DIAGNOSIS — B999 Unspecified infectious disease: Secondary | ICD-10-CM

## 2024-12-09 DIAGNOSIS — B9689 Other specified bacterial agents as the cause of diseases classified elsewhere: Secondary | ICD-10-CM | POA: Diagnosis not present

## 2024-12-09 DIAGNOSIS — J019 Acute sinusitis, unspecified: Secondary | ICD-10-CM

## 2024-12-09 DIAGNOSIS — J302 Other seasonal allergic rhinitis: Secondary | ICD-10-CM | POA: Diagnosis not present

## 2024-12-09 DIAGNOSIS — L501 Idiopathic urticaria: Secondary | ICD-10-CM

## 2024-12-09 DIAGNOSIS — J3089 Other allergic rhinitis: Secondary | ICD-10-CM

## 2024-12-09 DIAGNOSIS — R0602 Shortness of breath: Secondary | ICD-10-CM

## 2024-12-09 MED ORDER — AMOXICILLIN-POT CLAVULANATE 875-125 MG PO TABS
1.0000 | ORAL_TABLET | Freq: Two times a day (BID) | ORAL | 0 refills | Status: AC
  Filled 2024-12-09 (×2): qty 20, 10d supply, fill #0

## 2024-12-09 MED ORDER — CARBINOXAMINE MALEATE 4 MG PO TABS
8.0000 mg | ORAL_TABLET | Freq: Two times a day (BID) | ORAL | 1 refills | Status: AC
  Filled 2024-12-09: qty 120, 30d supply, fill #0

## 2024-12-09 MED ORDER — EPINEPHRINE 0.3 MG/0.3ML IJ SOAJ
0.3000 mg | INTRAMUSCULAR | 1 refills | Status: AC | PRN
  Filled 2024-12-09: qty 2, 1d supply, fill #0

## 2024-12-09 NOTE — Patient Instructions (Addendum)
 Acute sinusitis  Begin Augmentin  875 mg twice a day for the next 10 days  Begin nasal saline rinses followed by Flonase  nasal spray daily  Begin Mucinex  1200 mg twice a day and increase fluid intake   Shortness of breath Continue albuterol  2 puffs once every 4 hours as needed for cough or wheeze You may use albuterol  2 puffs 5 to 15 minutes before activity to decrease cough or wheeze  Allergic rhinitis Continue Flonase  2 sprays in each nostril once a day for nasal congestion Continue to azelastine  2 sprays in each nostril twice a day for runny nose Begin Mucinex  600 to 1200 mg twice a day and increase fluid intake as tolerated to thin mucus Continue saline nasal rinses as needed for nasal symptoms. Use this before any medicated nasal sprays for best result Continue allergen avoidance measures directed toward grass pollen, weed pollen, tree pollen, indoor mold, dust mite, cat, dog and cockroach as listed below Continue carbinoxamine  4 mg twice a day if needed for runny nose or itch Continue allergen immunotherapy and have access to an epinephrine  autoinjector set.  Do not get an injection this week.  Wait until you are feeling well to get your allergy  injection.  Chronic urticaria Continue carbinoxamine  4 mg twice a day if needed for itch.  May add famotidine  20 mg once or twice a day if needed for itch Restart Xolair  injections once every 28 days and have access to an epinephrine  autoinjector sent per protocol after your allergy  testing appointment  Food allergy  Resolved.  Patient reports that she is eating shellfish and shrimp without anaphylactic symptoms.  Recurrent infection Continue to keep track of infections, antibiotics, and steroid use. Labs were drawn at today's visit to help us  evaluate your immune system.  We will call you when the results become available.  Call the clinic if this treatment plan is not working well for you.    Follow up in 2 months or sooner if needed.   Already she is already-year-old   Reducing Pollen Exposure The American Academy of Allergy , Asthma and Immunology suggests the following steps to reduce your exposure to pollen during allergy  seasons. Do not hang sheets or clothing out to dry; pollen may collect on these items. Do not mow lawns or spend time around freshly cut grass; mowing stirs up pollen. Keep windows closed at night.  Keep car windows closed while driving. Minimize morning activities outdoors, a time when pollen counts are usually at their highest. Stay indoors as much as possible when pollen counts or humidity is high and on windy days when pollen tends to remain in the air longer. Use air conditioning when possible.  Many air conditioners have filters that trap the pollen spores. Use a HEPA room air filter to remove pollen form the indoor air you breathe.  Control of Mold Allergen Mold and fungi can grow on a variety of surfaces provided certain temperature and moisture conditions exist.  Outdoor molds grow on plants, decaying vegetation and soil.  The major outdoor mold, Alternaria and Cladosporium, are found in very high numbers during hot and dry conditions.  Generally, a late Summer - Fall peak is seen for common outdoor fungal spores.  Rain will temporarily lower outdoor mold spore count, but counts rise rapidly when the rainy period ends.  The most important indoor molds are Aspergillus and Penicillium.  Dark, humid and poorly ventilated basements are ideal sites for mold growth.  The next most common sites of mold growth  are the bathroom and the kitchen.  Outdoor [employer] Use air conditioning and keep windows closed Avoid exposure to decaying vegetation. Avoid leaf raking. Avoid grain handling. Consider wearing a face mask if working in moldy areas.  Indoor Mold Control Maintain humidity below 50%. Clean washable surfaces with 5% bleach solution. Remove sources e.g. Contaminated carpets.  Control of Dog or  Cat Allergen Avoidance is the best way to manage a dog or cat allergy . If you have a dog or cat and are allergic to dog or cats, consider removing the dog or cat from the home. If you have a dog or cat but dont want to find it a new home, or if your family wants a pet even though someone in the household is allergic, here are some strategies that may help keep symptoms at bay:  Keep the pet out of your bedroom and restrict it to only a few rooms. Be advised that keeping the dog or cat in only one room will not limit the allergens to that room. Dont pet, hug or kiss the dog or cat; if you do, wash your hands with soap and water. High-efficiency particulate air (HEPA) cleaners run continuously in a bedroom or living room can reduce allergen levels over time. Regular use of a high-efficiency vacuum cleaner or a central vacuum can reduce allergen levels. Giving your dog or cat a bath at least once a week can reduce airborne allergen.   Control of Dust Mite Allergen Dust mites play a major role in allergic asthma and rhinitis. They occur in environments with high humidity wherever human skin is found. Dust mites absorb humidity from the atmosphere (ie, they do not drink) and feed on organic matter (including shed human and animal skin). Dust mites are a microscopic type of insect that you cannot see with the naked eye. High levels of dust mites have been detected from mattresses, pillows, carpets, upholstered furniture, bed covers, clothes, soft toys and any woven material. The principal allergen of the dust mite is found in its feces. A gram of dust may contain 1,000 mites and 250,000 fecal particles. Mite antigen is easily measured in the air during house cleaning activities. Dust mites do not bite and do not cause harm to humans, other than by triggering allergies/asthma.  Ways to decrease your exposure to dust mites in your home:  1. Encase mattresses, box springs and pillows with a mite-impermeable  barrier or cover  2. Wash sheets, blankets and drapes weekly in hot water (130 F) with detergent and dry them in a dryer on the hot setting.  3. Have the room cleaned frequently with a vacuum cleaner and a damp dust-mop. For carpeting or rugs, vacuuming with a vacuum cleaner equipped with a high-efficiency particulate air (HEPA) filter. The dust mite allergic individual should not be in a room which is being cleaned and should wait 1 hour after cleaning before going into the room.  4. Do not sleep on upholstered furniture (eg, couches).  5. If possible removing carpeting, upholstered furniture and drapery from the home is ideal. Horizontal blinds should be eliminated in the rooms where the person spends the most time (bedroom, study, television room). Washable vinyl, roller-type shades are optimal.  6. Remove all non-washable stuffed toys from the bedroom. Wash stuffed toys weekly like sheets and blankets above.  7. Reduce indoor humidity to less than 50%. Inexpensive humidity monitors can be purchased at most hardware stores. Do not use a humidifier as can make  the problem worse and are not recommended.  Control of Cockroach Allergen Cockroach allergen has been identified as an important cause of acute attacks of asthma, especially in urban settings.  There are fifty-five species of cockroach that exist in the [place] , however only three, the American, German and Oriental species produce allergen that can affect patients with Asthma.  Allergens can be obtained from fecal particles, egg casings and secretions from cockroaches.    Remove food sources. Reduce access to water. Seal access and entry points. Spray runways with 0.5-1% Diazinon or Chlorpyrifos Blow boric acid power under stoves and refrigerator. Place bait stations (hydramethylnon) at feeding sites.

## 2024-12-09 NOTE — Progress Notes (Signed)
 "  [address] [place] [address] Dept: [phone]  FOLLOW UP NOTE  Patient ID: Emma Larsen, female    DOB: 03/18/1971  Age: 54 y.o. MRN: [id number] Date of Office Visit: 12/09/2024  Assessment  Chief Complaint: Headache, Sinus Problem (Fluid in ears ), Nasal Congestion, and Cough (Yellow mucous )  HPI Emma Larsen is a 54 year old female who presents to the clinic for follow-up visit.  She was last seen in this clinic on 09/14/2024 by Arlean Mutter, FNP, for evaluation of acute bacterial sinusitis requiring Augmentin , urticaria, allergic rhinitis, cough, and food allergy  to shellfish.  He completed [facility] immunotherapy on 11/12/2024.  In the interim, she reports that she went to her primary care provider for sinus symptoms about 2 weeks ago and was prescribed azithromycin  with mild relief of symptoms.  At today's visit, she reports that she continues to experience sinus symptoms including headache above her eyes and below her eyes, nasal congestion, sneezing, yellow to light green drainage and postnasal drainage with some sore throat.  She reports that she did experience some relief of symptoms after taking azithromycin , however, the symptoms returned shortly after finishing azithromycin  dose.  Chart review indicates needing antibiotics at least 4 occasions since August 2025.  Allergic rhinitis is reported as moderately well-controlled with frequent nasal symptoms.  She continues Navage nasal rinse and has received Ryaltris  nasal spray as of yesterday.  She continues carbinoxamine  4 mg tablets twice a day with moderate relief of symptoms.  She continues allergen immunotherapy directed toward grass pollen, weed pollen, tree pollen, horse, dog, ragweed, mold, and dust mite.  She does report some large local reactions and file #1 containing grass pollen, weed pollen, tree pollen, horse and dog.  Her immunotherapy dose has been backed down since her last visit.  Urticaria is reported as moderately  well-controlled with infrequent episodes of hives and itch.  She continue carbinoxamine  twice a day and Xolair  injections 300 mg once every 28 days.  Epinephrine  autoinjector sent is out of date and will be reordered at today's visit.  She reports that she has not avoiding any foods at this time including shellfish or shrimp.  She reports that she is able to eat all foods without adverse reaction including shellfish and shrimp.  Her current medications are listed in the chart.  Drug Allergies:  Allergies[1]  Physical Exam: BP (!) 130/100   Pulse 87   Temp 98.1 F (36.7 C)   Ht 5' 2 (1.575 m)   Wt 154 lb 9.6 oz (70.1 kg)   LMP 11/12/2021 Comment: irregular  SpO2 97%   BMI 28.28 kg/m    Physical Exam Vitals reviewed.  Constitutional:      Appearance: Normal appearance. She is well-developed.  HENT:     Head: Normocephalic and atraumatic.     Right Ear: Tympanic membrane normal.     Left Ear: Tympanic membrane normal.     Nose:     Comments: Bilateral nares edematous and painful with thin clear nasal drainage noted.  Pharynx slightly erythematous with no exudate.  Ears normal.  Eyes normal. Eyes:     Conjunctiva/sclera: Conjunctivae normal.  Cardiovascular:     Rate and Rhythm: Normal rate and regular rhythm.     Heart sounds: Normal heart sounds. No murmur heard. Pulmonary:     Effort: Pulmonary effort is normal.     Breath sounds: Normal breath sounds.     Comments: Lungs clear to auscultation Musculoskeletal:  General: Normal range of motion.     Cervical back: Normal range of motion and neck supple.  Skin:    General: Skin is warm and dry.  Neurological:     Mental Status: She is alert and oriented to person, place, and time.  Psychiatric:        Mood and Affect: Mood normal.        Behavior: Behavior normal.        Thought Content: Thought content normal.        Judgment: Judgment normal.       Assessment and Plan: 1. Acute bacterial sinusitis   2.  Shortness of breath   3. Seasonal and perennial allergic rhinitis   4. Idiopathic urticaria   5. Recurrent infections     Meds ordered this encounter  Medications   Carbinoxamine  Maleate 4 MG TABS    Sig: Take 2 tablets (8 mg total) by mouth in the morning and at bedtime.    Dispense:  120 tablet    Refill:  1   amoxicillin -clavulanate (AUGMENTIN ) 875-125 MG tablet    Sig: Take 1 tablet by mouth 2 (two) times daily for 10 days.    Dispense:  20 tablet    Refill:  0   EPINEPHrine  (EPIPEN  2-PAK) 0.3 mg/0.3 mL IJ SOAJ injection    Sig: Inject 0.3 mg into the muscle as needed for anaphylaxis.    Dispense:  2 each    Refill:  1    Patient Instructions  Acute sinusitis  Begin Augmentin  875 mg twice a day for the next 10 days  Begin nasal saline rinses followed by Flonase  nasal spray daily  Begin Mucinex  1200 mg twice a day and increase fluid intake   Shortness of breath Continue albuterol  2 puffs once every 4 hours as needed for cough or wheeze You may use albuterol  2 puffs 5 to 15 minutes before activity to decrease cough or wheeze  Allergic rhinitis Continue Flonase  2 sprays in each nostril once a day for nasal congestion Continue to azelastine  2 sprays in each nostril twice a day for runny nose Begin Mucinex  600 to 1200 mg twice a day and increase fluid intake as tolerated to thin mucus Continue saline nasal rinses as needed for nasal symptoms. Use this before any medicated nasal sprays for best result Continue allergen avoidance measures directed toward grass pollen, weed pollen, tree pollen, indoor mold, dust mite, cat, dog and cockroach as listed below Continue carbinoxamine  4 mg twice a day if needed for runny nose or itch Continue allergen immunotherapy and have access to an epinephrine  autoinjector set.  Do not get an injection this week.  Wait until you are feeling well to get your allergy  injection.  Chronic urticaria Continue carbinoxamine  4 mg twice a day if needed  for itch.  May add famotidine  20 mg once or twice a day if needed for itch Restart Xolair  injections once every 28 days and have access to an epinephrine  autoinjector sent per protocol after your allergy  testing appointment  Food allergy  Resolved.  Patient reports that she is eating shellfish and shrimp without anaphylactic symptoms.  Recurrent infection Continue to keep track of infections, antibiotics, and steroid use. Labs were drawn at today's visit to help us  evaluate your immune system.  We will call you when the results become available.  Call the clinic if this treatment plan is not working well for you.    Follow up in 2 months or sooner if needed.  Already she is already-year-old   Return in about 2 months (around 02/06/2025), or if symptoms worsen or fail to improve.    Thank you for the opportunity to care for this patient.  Please do not hesitate to contact me with questions.  Arlean Mutter, FNP [facility]          [1] No Known Allergies  "

## 2024-12-10 ENCOUNTER — Telehealth: Payer: Self-pay | Admitting: Family Medicine

## 2024-12-10 ENCOUNTER — Other Ambulatory Visit (HOSPITAL_COMMUNITY): Payer: Self-pay

## 2024-12-10 NOTE — Telephone Encounter (Signed)
 Error

## 2024-12-11 ENCOUNTER — Other Ambulatory Visit (HOSPITAL_COMMUNITY): Payer: Self-pay

## 2024-12-12 ENCOUNTER — Other Ambulatory Visit (HOSPITAL_COMMUNITY): Payer: Self-pay

## 2024-12-12 MED ORDER — NURTEC 75 MG PO TBDP
75.0000 mg | ORAL_TABLET | Freq: Every day | ORAL | 0 refills | Status: AC | PRN
  Filled 2024-12-12: qty 8, 8d supply, fill #0

## 2024-12-14 ENCOUNTER — Other Ambulatory Visit: Payer: Self-pay

## 2024-12-14 ENCOUNTER — Other Ambulatory Visit (HOSPITAL_COMMUNITY): Payer: Self-pay

## 2024-12-14 ENCOUNTER — Other Ambulatory Visit: Payer: Self-pay | Admitting: Family Medicine

## 2024-12-14 DIAGNOSIS — B999 Unspecified infectious disease: Secondary | ICD-10-CM

## 2024-12-14 LAB — CBC WITH DIFF/PLATELET
Basophils Absolute: 0 10*3/uL (ref 0.0–0.2)
Basos: 1 %
EOS (ABSOLUTE): 0.4 10*3/uL (ref 0.0–0.4)
Eos: 6 %
Hematocrit: 42.3 % (ref 34.0–46.6)
Hemoglobin: 13.5 g/dL (ref 11.1–15.9)
Immature Grans (Abs): 0 10*3/uL (ref 0.0–0.1)
Immature Granulocytes: 0 %
Lymphocytes Absolute: 1.8 10*3/uL (ref 0.7–3.1)
Lymphs: 30 %
MCH: 28.5 pg (ref 26.6–33.0)
MCHC: 31.9 g/dL (ref 31.5–35.7)
MCV: 89 fL (ref 79–97)
Monocytes Absolute: 0.4 10*3/uL (ref 0.1–0.9)
Monocytes: 7 %
Neutrophils Absolute: 3.4 10*3/uL (ref 1.4–7.0)
Neutrophils: 56 %
Platelets: 245 10*3/uL (ref 150–450)
RBC: 4.74 x10E6/uL (ref 3.77–5.28)
RDW: 11.8 % (ref 11.7–15.4)
WBC: 6.1 10*3/uL (ref 3.4–10.8)

## 2024-12-14 LAB — SPECIMEN STATUS REPORT

## 2024-12-14 LAB — IGG, IGA, IGM
IgG (Immunoglobin G), Serum: 1246 mg/dL (ref 586–1602)
IgM (Immunoglobulin M), Srm: 103 mg/dL (ref 26–217)
Immunoglobulin A, (IgA) QN, Serum: 562 mg/dL — ABNORMAL HIGH (ref 87–352)

## 2024-12-14 LAB — COMPLEMENT, TOTAL: Compl, Total (CH50): 60 U/mL

## 2024-12-14 LAB — DIPHTHERIA / TETANUS ANTIBODY PANEL
Diphtheria Ab: 0.42 [IU]/mL
Tetanus Ab, IgG: 0.99 [IU]/mL

## 2024-12-15 ENCOUNTER — Other Ambulatory Visit: Payer: Self-pay

## 2024-12-16 LAB — PNEUMOCOCCAL AB PNL PPSV23, 23 SEROTYPES

## 2024-12-16 LAB — SPECIMEN STATUS REPORT

## 2024-12-17 ENCOUNTER — Telehealth: Payer: Self-pay | Admitting: Family Medicine

## 2024-12-17 ENCOUNTER — Ambulatory Visit

## 2024-12-17 DIAGNOSIS — L501 Idiopathic urticaria: Secondary | ICD-10-CM

## 2024-12-17 NOTE — Telephone Encounter (Signed)
 Called and informed patient. Patient verbalized understanding

## 2024-12-17 NOTE — Telephone Encounter (Signed)
 Emma Larsen came in to receive her injection and stated that her labs came through via MyChart and she noticed her IgG A was abnormal.  Emma Larsen would like for someone to please call her and discuss this with her.  I did inform Emma Larsen  that Emma Larsen did not look at them yet to result them.  She was ok with this but just asked that someone call her about the abnormal lab.

## 2024-12-17 NOTE — Telephone Encounter (Signed)
 Still waiting on one test for the final wrap up. The igA was slightly elevated. We worry when the IgA is decreased in the setting of frequent infections. Will call with a full wrap up once the last test  result becomes available. Thank you

## 2025-01-14 ENCOUNTER — Ambulatory Visit
